# Patient Record
Sex: Male | Born: 1975 | Race: White | Hispanic: No | Marital: Married | Smoking: Current every day smoker
Health system: Southern US, Community
[De-identification: ages and names within clinical notes are randomized; demographics above are authoritative.]

---

## 2005-06-21 ENCOUNTER — Emergency Department (HOSPITAL_COMMUNITY): Admission: RE | Admit: 2005-06-21 | Discharge: 2005-06-22 | Payer: Self-pay | Admitting: Emergency Medicine

## 2005-06-23 ENCOUNTER — Ambulatory Visit: Admission: RE | Admit: 2005-06-23 | Discharge: 2005-06-23 | Payer: Self-pay | Admitting: Orthopaedic Surgery

## 2005-07-10 ENCOUNTER — Encounter: Admission: RE | Admit: 2005-07-10 | Discharge: 2005-07-10 | Payer: Self-pay | Admitting: Orthopaedic Surgery

## 2011-06-10 ENCOUNTER — Emergency Department (HOSPITAL_COMMUNITY)
Admission: EM | Admit: 2011-06-10 | Discharge: 2011-06-11 | Disposition: A | Discharge status: Home / Self Care | Payer: No Typology Code available for payment source | Attending: Emergency Medicine | Admitting: Emergency Medicine

## 2011-06-10 ENCOUNTER — Emergency Department (HOSPITAL_COMMUNITY): Payer: No Typology Code available for payment source

## 2011-06-10 DIAGNOSIS — S61209A Unspecified open wound of unspecified finger without damage to nail, initial encounter: Secondary | ICD-10-CM | POA: Insufficient documentation

## 2011-06-10 DIAGNOSIS — M542 Cervicalgia: Secondary | ICD-10-CM | POA: Insufficient documentation

## 2011-06-10 DIAGNOSIS — S129XXA Fracture of neck, unspecified, initial encounter: Secondary | ICD-10-CM | POA: Insufficient documentation

## 2011-06-10 DIAGNOSIS — S9030XA Contusion of unspecified foot, initial encounter: Secondary | ICD-10-CM | POA: Insufficient documentation

## 2016-12-22 DIAGNOSIS — M7541 Impingement syndrome of right shoulder: Secondary | ICD-10-CM | POA: Diagnosis not present

## 2016-12-22 DIAGNOSIS — M7542 Impingement syndrome of left shoulder: Secondary | ICD-10-CM | POA: Diagnosis not present

## 2017-04-05 ENCOUNTER — Emergency Department (HOSPITAL_COMMUNITY): Payer: 59

## 2017-04-05 ENCOUNTER — Encounter (HOSPITAL_COMMUNITY): Payer: Self-pay | Admitting: Emergency Medicine

## 2017-04-05 ENCOUNTER — Inpatient Hospital Stay (HOSPITAL_COMMUNITY)
Admission: EM | Admit: 2017-04-05 | Discharge: 2017-04-07 | DRG: 603 | Disposition: A | Discharge status: Home / Self Care | Payer: 59 | Attending: Internal Medicine | Admitting: Internal Medicine

## 2017-04-05 DIAGNOSIS — F419 Anxiety disorder, unspecified: Secondary | ICD-10-CM | POA: Diagnosis present

## 2017-04-05 DIAGNOSIS — W458XXA Other foreign body or object entering through skin, initial encounter: Secondary | ICD-10-CM | POA: Diagnosis present

## 2017-04-05 DIAGNOSIS — S81011A Laceration without foreign body, right knee, initial encounter: Secondary | ICD-10-CM | POA: Diagnosis not present

## 2017-04-05 DIAGNOSIS — L03115 Cellulitis of right lower limb: Secondary | ICD-10-CM | POA: Diagnosis not present

## 2017-04-05 DIAGNOSIS — L02415 Cutaneous abscess of right lower limb: Secondary | ICD-10-CM | POA: Diagnosis not present

## 2017-04-05 DIAGNOSIS — Y92832 Beach as the place of occurrence of the external cause: Secondary | ICD-10-CM

## 2017-04-05 DIAGNOSIS — R6510 Systemic inflammatory response syndrome (SIRS) of non-infectious origin without acute organ dysfunction: Secondary | ICD-10-CM | POA: Diagnosis not present

## 2017-04-05 DIAGNOSIS — M25561 Pain in right knee: Secondary | ICD-10-CM

## 2017-04-05 DIAGNOSIS — D72829 Elevated white blood cell count, unspecified: Secondary | ICD-10-CM

## 2017-04-05 DIAGNOSIS — L03116 Cellulitis of left lower limb: Secondary | ICD-10-CM | POA: Diagnosis not present

## 2017-04-05 DIAGNOSIS — F17290 Nicotine dependence, other tobacco product, uncomplicated: Secondary | ICD-10-CM | POA: Diagnosis present

## 2017-04-05 DIAGNOSIS — R112 Nausea with vomiting, unspecified: Secondary | ICD-10-CM | POA: Diagnosis present

## 2017-04-05 DIAGNOSIS — L02416 Cutaneous abscess of left lower limb: Secondary | ICD-10-CM | POA: Diagnosis not present

## 2017-04-05 DIAGNOSIS — B9561 Methicillin susceptible Staphylococcus aureus infection as the cause of diseases classified elsewhere: Secondary | ICD-10-CM | POA: Diagnosis present

## 2017-04-05 DIAGNOSIS — M7040 Prepatellar bursitis, unspecified knee: Secondary | ICD-10-CM | POA: Diagnosis present

## 2017-04-05 LAB — CBC WITH DIFFERENTIAL/PLATELET
Basophils Absolute: 0 10*3/uL (ref 0.0–0.1)
Basophils Relative: 0 %
EOS PCT: 0 %
Eosinophils Absolute: 0.1 10*3/uL (ref 0.0–0.7)
HCT: 44.8 % (ref 39.0–52.0)
HEMOGLOBIN: 15.1 g/dL (ref 13.0–17.0)
LYMPHS PCT: 7 %
Lymphs Abs: 1.5 10*3/uL (ref 0.7–4.0)
MCH: 31 pg (ref 26.0–34.0)
MCHC: 33.7 g/dL (ref 30.0–36.0)
MCV: 92 fL (ref 78.0–100.0)
MONOS PCT: 9 %
Monocytes Absolute: 1.7 10*3/uL — ABNORMAL HIGH (ref 0.1–1.0)
Neutro Abs: 16.5 10*3/uL — ABNORMAL HIGH (ref 1.7–7.7)
Neutrophils Relative %: 84 %
Platelets: 320 10*3/uL (ref 150–400)
RBC: 4.87 MIL/uL (ref 4.22–5.81)
RDW: 13.2 % (ref 11.5–15.5)
WBC: 19.7 10*3/uL — ABNORMAL HIGH (ref 4.0–10.5)

## 2017-04-05 LAB — BASIC METABOLIC PANEL
Anion gap: 9 (ref 5–15)
BUN: 5 mg/dL — ABNORMAL LOW (ref 6–20)
CHLORIDE: 103 mmol/L (ref 101–111)
CO2: 25 mmol/L (ref 22–32)
Calcium: 8.7 mg/dL — ABNORMAL LOW (ref 8.9–10.3)
Creatinine, Ser: 0.84 mg/dL (ref 0.61–1.24)
GFR calc Af Amer: >60 mL/min (ref >60)
GFR calc non Af Amer: >60 mL/min (ref >60)
Glucose, Bld: 107 mg/dL — ABNORMAL HIGH (ref 65–99)
Potassium: 3.9 mmol/L (ref 3.5–5.1)
Sodium: 137 mmol/L (ref 135–145)

## 2017-04-05 LAB — C-REACTIVE PROTEIN: CRP: 19.8 mg/dL — ABNORMAL HIGH (ref <1.0)

## 2017-04-05 LAB — SEDIMENTATION RATE: Sed Rate: 28 mm/hr — ABNORMAL HIGH (ref 0–16)

## 2017-04-05 MED ORDER — ACETAMINOPHEN 500 MG PO TABS
1000.0000 mg | ORAL_TABLET | Freq: Once | ORAL | Status: AC
  Administered 2017-04-05: 1000 mg via ORAL
  Filled 2017-04-05: qty 2

## 2017-04-05 MED ORDER — PIPERACILLIN-TAZOBACTAM 3.375 G IVPB
3.3750 g | Freq: Three times a day (TID) | INTRAVENOUS | Status: DC
  Administered 2017-04-05 – 2017-04-07 (×5): 3.375 g via INTRAVENOUS
  Filled 2017-04-05 (×6): qty 50

## 2017-04-05 MED ORDER — ACETAMINOPHEN 325 MG PO TABS
650.0000 mg | ORAL_TABLET | Freq: Four times a day (QID) | ORAL | Status: DC | PRN
  Administered 2017-04-05: 650 mg via ORAL
  Filled 2017-04-05: qty 2

## 2017-04-05 MED ORDER — ACETAMINOPHEN 650 MG RE SUPP: 650.0000 mg | Freq: Four times a day (QID) | RECTAL | Status: DC | PRN

## 2017-04-05 MED ORDER — HYDROCODONE-ACETAMINOPHEN 5-325 MG PO TABS
1.0000 | ORAL_TABLET | ORAL | Status: DC | PRN
  Administered 2017-04-05 (×2): 2 via ORAL
  Administered 2017-04-06: 1 via ORAL
  Administered 2017-04-06: 2 via ORAL
  Filled 2017-04-05 (×4): qty 2

## 2017-04-05 MED ORDER — GADOBENATE DIMEGLUMINE 529 MG/ML IV SOLN
20.0000 mL | Freq: Once | INTRAVENOUS | Status: AC
  Administered 2017-04-05: 17 mL via INTRAVENOUS

## 2017-04-05 MED ORDER — IBUPROFEN 200 MG PO TABS
200.0000 mg | ORAL_TABLET | Freq: Four times a day (QID) | ORAL | Status: DC | PRN
  Administered 2017-04-05 – 2017-04-06 (×2): 200 mg via ORAL
  Filled 2017-04-05 (×2): qty 1

## 2017-04-05 MED ORDER — VANCOMYCIN HCL 10 G IV SOLR
1500.0000 mg | Freq: Two times a day (BID) | INTRAVENOUS | Status: DC
  Administered 2017-04-06 – 2017-04-07 (×3): 1500 mg via INTRAVENOUS
  Filled 2017-04-05 (×4): qty 1500

## 2017-04-05 MED ORDER — NICOTINE 14 MG/24HR TD PT24
14.0000 mg | MEDICATED_PATCH | TRANSDERMAL | Status: DC
  Administered 2017-04-05 – 2017-04-06 (×2): 14 mg via TRANSDERMAL
  Filled 2017-04-05 (×2): qty 1

## 2017-04-05 MED ORDER — ONDANSETRON HCL 4 MG/2ML IJ SOLN: 4.0000 mg | Freq: Four times a day (QID) | INTRAMUSCULAR | Status: DC | PRN

## 2017-04-05 MED ORDER — VANCOMYCIN HCL 500 MG IV SOLR
500.0000 mg | Freq: Once | INTRAVENOUS | Status: AC
  Administered 2017-04-05: 500 mg via INTRAVENOUS
  Filled 2017-04-05: qty 500

## 2017-04-05 MED ORDER — ONDANSETRON HCL 4 MG/2ML IJ SOLN
4.0000 mg | Freq: Once | INTRAMUSCULAR | Status: AC
  Administered 2017-04-05: 4 mg via INTRAVENOUS
  Filled 2017-04-05: qty 2

## 2017-04-05 MED ORDER — ONDANSETRON HCL 4 MG PO TABS: 4.0000 mg | ORAL_TABLET | Freq: Four times a day (QID) | ORAL | Status: DC | PRN

## 2017-04-05 MED ORDER — MORPHINE SULFATE (PF) 4 MG/ML IV SOLN
4.0000 mg | INTRAVENOUS | Status: DC | PRN
  Administered 2017-04-05 – 2017-04-06 (×4): 4 mg via INTRAVENOUS
  Filled 2017-04-05 (×4): qty 1

## 2017-04-05 MED ORDER — VANCOMYCIN HCL IN DEXTROSE 1-5 GM/200ML-% IV SOLN
1000.0000 mg | Freq: Once | INTRAVENOUS | Status: AC
  Administered 2017-04-05: 1000 mg via INTRAVENOUS
  Filled 2017-04-05: qty 200

## 2017-04-05 MED ORDER — PIPERACILLIN-TAZOBACTAM 3.375 G IVPB 30 MIN
3.3750 g | Freq: Once | INTRAVENOUS | Status: AC
  Administered 2017-04-05: 3.375 g via INTRAVENOUS
  Filled 2017-04-05: qty 50

## 2017-04-05 MED ORDER — MORPHINE SULFATE (PF) 4 MG/ML IV SOLN
4.0000 mg | Freq: Once | INTRAVENOUS | Status: AC
  Administered 2017-04-05: 4 mg via INTRAVENOUS
  Filled 2017-04-05: qty 1

## 2017-04-05 MED ORDER — LIDOCAINE-EPINEPHRINE 1 %-1:100000 IJ SOLN: 10.0000 mL | Freq: Once | INTRAMUSCULAR | Status: DC

## 2017-04-05 MED ORDER — LIDOCAINE-EPINEPHRINE (PF) 2 %-1:200000 IJ SOLN
10.0000 mL | Freq: Once | INTRAMUSCULAR | Status: AC
  Administered 2017-04-05: 10 mL via INTRADERMAL
  Filled 2017-04-05: qty 20

## 2017-04-05 NOTE — ED Notes (Signed)
Ortho PA at bedside.  

## 2017-04-05 NOTE — ED Provider Notes (Signed)
[facility] Provider Note   CSN: [id number] Arrival date & time: 04/05/17  0848     History   Chief Complaint Chief Complaint  Patient presents with  . Knee Pain    HPI Alex Hurst is a 41 y.o. male.  HPI  Pt presenting with c/o right knee and leg swelling and pain.  Pt states he had a small cut over his knee 2 weeks ago while he was at the beach.  The cut appeared to be healing normally and had a scab overlying, 2 days ago he got down onto his knees playing with children and felt a lot of pain in his knee, the scab came off, there was no drainage.  2 mornings ago he could not bear weight on the right knee and the knee and leg were red and swollen.  Symptoms have gotten worse over the 2 days.  He is not able to bend his knee without significant pain.  He has had subjective fever at home with chills.  No treatment prior to arrival.  Redness has increased down to ankle and going up thigh as well.  There are no other associated systemic symptoms, there are no other alleviating or modifying factors.   History reviewed. No pertinent past medical history.  Patient Active Problem List   Diagnosis Date Noted  . Abscess of knee, right 04/05/2017  . Leukocytosis 04/05/2017    History reviewed. No pertinent surgical history.     Home Medications    Prior to Admission medications   Medication Sig Start Date End Date Taking? Authorizing Provider  ibuprofen (ADVIL,MOTRIN) 200 MG tablet Take 200 mg by mouth every 6 (six) hours as needed for moderate pain.   Yes [provider]  pseudoephedrine (SUDAFED) 30 MG tablet Take 30 mg by mouth every 4 (four) hours as needed for congestion.   Yes [provider]  oxyCODONE-acetaminophen (PERCOCET/ROXICET) 5-325 MG tablet Take 1-2 tablets by mouth every 3 (three) hours as needed for moderate pain. 04/07/17   Dorothea Ogle, MD  sulfamethoxazole-trimethoprim (BACTRIM DS,SEPTRA DS) 800-160 MG tablet Take 1 tablet by  mouth 2 (two) times daily. 04/07/17   Dorothea Ogle, MD    Family History Family History  Problem Relation Age of Onset  . Lung cancer Maternal Grandmother   . Lung cancer Paternal Grandmother     Social History Social History  Substance Use Topics  . Smoking status: Current Every Day Smoker  . Smokeless tobacco: Current User  . Alcohol use Yes     Allergies   Patient has no known allergies.   Review of Systems Review of Systems  ROS reviewed and all otherwise negative except for mentioned in HPI   Physical Exam Updated Vital Signs BP 115/79 (BP Location: Left Arm)   Pulse 81   Temp 97.8 F (36.6 C) (Oral)   Resp 18   Ht 5\' 6"  (1.676 m)   Wt 82.6 kg (182 lb)   SpO2 97%   BMI 29.38 kg/m  Vitals reviewed Physical Exam Physical Examination: General appearance - alert, well appearing, and in no distress Mental status - alert, oriented to person, place, and time Eyes - no conjunctival injection, no scleral icterus Chest - clear to auscultation, no wheezes, rales or rhonchi, symmetric air entry Heart - normal rate, regular rhythm, normal S1, S2, no murmurs, rubs, clicks or gallops Neurological - alert, oriented, normal speech, no focal findings or movement disorder noted Musculoskeletal -right knee with significant swelling  and pain with minimal attempts at ROM,  No ballotable effusion, no bondy tenderness, deformity Extremities - peripheral pulses normal,no clubbing or cyanosis Skin - significant erythema overlying knee joint- streaking up thing and over most of lower extremity, blanching  ED Treatments / Results  Labs (all labs ordered are listed, but only abnormal results are displayed) Labs Reviewed  CBC WITH DIFFERENTIAL/PLATELET - Abnormal; Notable for the following:       Result Value   WBC 19.7 (*)    Neutro Abs 16.5 (*)    Monocytes Absolute 1.7 (*)    All other components within normal limits  BASIC METABOLIC PANEL - Abnormal; Notable for the  following:    Glucose, Bld 107 (*)    BUN 5 (*)    Calcium 8.7 (*)    All other components within normal limits  SEDIMENTATION RATE - Abnormal; Notable for the following:    Sed Rate 28 (*)    All other components within normal limits  C-REACTIVE PROTEIN - Abnormal; Notable for the following:    CRP 19.8 (*)    All other components within normal limits  CBC - Abnormal; Notable for the following:    WBC 16.4 (*)    All other components within normal limits  BASIC METABOLIC PANEL - Abnormal; Notable for the following:    Calcium 8.5 (*)    All other components within normal limits  CBC - Abnormal; Notable for the following:    WBC 15.9 (*)    All other components within normal limits  BASIC METABOLIC PANEL - Abnormal; Notable for the following:    BUN 5 (*)    Calcium 8.8 (*)    All other components within normal limits  CULTURE, BLOOD (ROUTINE X 2)  CULTURE, BLOOD (ROUTINE X 2)  AEROBIC CULTURE (SUPERFICIAL SPECIMEN)    EKG  EKG Interpretation None       Radiology No results found.  Procedures Procedures (including critical care time)  Medications Ordered in ED Medications  morphine 4 MG/ML injection 4 mg (4 mg Intravenous Given 04/05/17 1050)  ondansetron (ZOFRAN) injection 4 mg (4 mg Intravenous Given 04/05/17 1045)  acetaminophen (TYLENOL) tablet 1,000 mg (1,000 mg Oral Given 04/05/17 1052)  gadobenate dimeglumine (MULTIHANCE) injection 20 mL (17 mLs Intravenous Contrast Given 04/05/17 1155)  vancomycin (VANCOCIN) IVPB 1000 mg/200 mL premix (0 mg Intravenous Stopped 04/05/17 1532)  piperacillin-tazobactam (ZOSYN) IVPB 3.375 g (0 g Intravenous Stopped 04/05/17 1401)  lidocaine-EPINEPHrine (XYLOCAINE W/EPI) 2 %-1:200000 (PF) injection 10 mL (10 mLs Intradermal Given by Other 04/05/17 1315)  vancomycin (VANCOCIN) 500 mg in sodium chloride 0.9 % 100 mL IVPB (0 mg Intravenous Stopped 04/05/17 1952)     Initial Impression / Assessment and Plan / ED Course  I have reviewed the  triage vital signs and the nursing notes.  Pertinent labs & imaging results that were available during my care of the patient were reviewed by me and considered in my medical decision making (see chart for details).    10:18 AM xray and WBC obtained- WBC elevated-  D/w ortho who will see patient in the ED in consultation- concern for septic joint- will await their recommendations before starting abx.  Pt remains stable.  .   10:45 AM ortho has seen patient and is recommending MR knee- this was ordered and they advise no abx in case they need to do arthocentesis.   2:18 PM  D/w Dr. Montez Moritaarter, [facility] for admission,  She will see patient in the  ED.       Final Clinical Impressions(s) / ED Diagnoses   Final diagnoses:  Cellulitis and abscess of right lower extremity  Leukocytosis, unspecified type    New Prescriptions Discharge Medication List as of 04/07/2017  9:39 AM    START taking these medications   Details  oxyCODONE-acetaminophen (PERCOCET/ROXICET) 5-325 MG tablet Take 1-2 tablets by mouth every 3 (three) hours as needed for moderate pain., Starting Sat 04/07/2017, Print    sulfamethoxazole-trimethoprim (BACTRIM DS,SEPTRA DS) 800-160 MG tablet Take 1 tablet by mouth 2 (two) times daily., Starting Sat 04/07/2017, Print         Jerelyn Scott, MD 04/12/17 [phone]

## 2017-04-05 NOTE — H&P (Signed)
History and Physical    Alex Hurst VHQ:[id number] DOB: Oct 04, 1975 DOA: 04/05/2017  PCP: [name], [name], MD   Patient coming from: Home  Chief Complaint: Right knee pain and swelling  HPI: Alex Hurst is a 41 y.o. gentleman with no significant past medical history who was in his baseline state of health until about 1.5 weeks ago.  While at the beach, he cut his right knee on a seashell.  There was no significant bleeding or drainage at that time, and the initial injury had grown as scab.  This week, he was down on his knees in the garage working (without any type of dressing to his knee) and since then he has developed progressive pain and swelling.  He has had subjective fevers and last night he had chills and sweats.  Pain was 12 out of 10 in intensity upon arrival to the ED today.  No rashes.  No bites.  He has has some nausea but no vomiting.  ED Course: Temp 99.9.  WBC count 19.7.  MRI of the right knee showed cellulitis and multiple subcutaneous abscess.  No joint involvement.  Orthopedic surgery was consulted in the ED, and I and D was performed at the bedside.  He has received vanc and zosyn.   Hospitalist asked to admit for IV antibiotics.  Patient reluctant to stay.  Review of Systems: As per HPI otherwise 10 systems reviewed and negative.   History reviewed. No pertinent past medical history.  History reviewed. No pertinent surgical history.   reports that he has been smoking.  He uses smokeless tobacco. He reports that he drinks alcohol. He reports that he does not use drugs.  Smokes 1ppd.  Occasional EtOH but no history of EtOH withdrawal.  No illicit drugs.  He is married.  He has two children.  [place] [employer] [place]  No Known Allergies  Family History  Problem Relation Age of Onset  . Lung cancer Maternal Grandmother   . Lung cancer Paternal Grandmother      Prior to Admission medications   Medication Sig Start Date End Date Taking? Authorizing  Provider  ibuprofen (ADVIL,MOTRIN) 200 MG tablet Take 200 mg by mouth every 6 (six) hours as needed for moderate pain.   Yes [provider]  pseudoephedrine (SUDAFED) 30 MG tablet Take 30 mg by mouth every 4 (four) hours as needed for congestion.   Yes [provider]    Physical Exam: Vitals:   04/05/17 1045 04/05/17 1300 04/05/17 1330 04/05/17 1400  BP: (!) 125/92 123/82 116/75 116/75  Pulse: 90 88 85 85  Resp: 16 16 16 16   Temp:      TempSrc:      SpO2: 96% 97% 95% 95%  Weight:      Height:          Constitutional: NAD, complaining of right knee pain, he is anxious Vitals:   04/05/17 1045 04/05/17 1300 04/05/17 1330 04/05/17 1400  BP: (!) 125/92 123/82 116/75 116/75  Pulse: 90 88 85 85  Resp: 16 16 16 16   Temp:      TempSrc:      SpO2: 96% 97% 95% 95%  Weight:      Height:       Eyes: PERRL, lids and conjunctivae normal ENMT: Mucous membranes are moist. Posterior pharynx clear of any exudate or lesions. Normal dentition.  Neck: normal appearance, supple, no masses Respiratory: clear to auscultation bilaterally, no wheezing, no crackles. Normal respiratory effort.  No accessory muscle use.  Cardiovascular: Normal rate, regular rhythm, no murmurs / rubs / gallops. No extremity edema. 2+ pedal pulses.  GI: abdomen is soft and compressible.  No distention.  No tenderness.  Bowel sounds are present. Musculoskeletal:  Right knee is markedly swollen compared to the left.  No joint deformity in upper extremities. Limited ROM at right knee due to pain and swelling.  No contractures. Normal muscle tone.  Skin: no rashes, dry, skin at right knee and right leg warm to touch.  Dressing to right knee at site of I and D.  Erythema spans from just above the right knee to ankle.  I did not find any significant areas of induration. Neurologic: No apparent focal deficits. Psychiatric: Normal judgment and insight. Alert and oriented x 3. Normal mood.     Labs on  Admission: I have personally reviewed following labs and imaging studies  CBC:  Recent Labs Lab 04/05/17 0932  WBC 19.7*  NEUTROABS 16.5*  HGB 15.1  HCT 44.8  MCV 92.0  PLT 320   Basic Metabolic Panel:  Recent Labs Lab 04/05/17 0932  NA 137  K 3.9  CL 103  CO2 25  GLUCOSE 107*  BUN 5*  CREATININE 0.84  CALCIUM 8.7*   GFR: Estimated Creatinine Clearance: 117.9 mL/min (by C-G formula based on SCr of 0.84 mg/dL).  Radiological Exams on Admission: Mr Knee Right W Wo Contrast  Result Date: 04/05/2017 CLINICAL DATA:  History of laceration on the right knee by a sea shell mid June,2018. The patient developed redness, swelling, fever and chills over the past week. EXAM: MRI OF THE RIGHT KNEE WITHOUT AND WITH CONTRAST TECHNIQUE: Multiplanar, multisequence MR imaging of the knee was performed before and after the administration of intravenous contrast. CONTRAST:  17 ml MULTIHANCE GADOBENATE DIMEGLUMINE 529 MG/ML IV SOLN COMPARISON:  Plain films right knee this same day. FINDINGS: MENISCI Medial meniscus:  Intact. Lateral meniscus:  Intact. LIGAMENTS Cruciates:  Intact. Collaterals:  Intact. CARTILAGE Patellofemoral:  Normal. Medial:  Normal. Lateral:  Normal. Joint:  Trace amount of joint fluid. Popliteal Fossa:  No Baker's cyst. Extensor Mechanism:  Intact. Bones:  Normal marrow signal throughout. Other: Intense subcutaneous edema and enhancement are present about the knee diffusely. A subcutaneous abscess measuring 0.9 cm AP x 2.8 cm transverse x 1.8 cm craniocaudal is seen in the midline just below the inferior pole of the patella. A second somewhat focal area of fluid worrisome for abscess in the subcutaneous tissues is seen along the medial aspect of the proximal tibia and measures 1.9 cm AP by 0.4 cm transverse by 4.3 cm craniocaudal. More ill-defined areas of fluid in subcutaneous tissues about the knee consistent with phlegmon. IMPRESSION: Intense cellulitis about the knee with a  subcutaneous abscess just off the inferior pole of the patella and along the medial tibia as described above. There may be a second smaller abscess in the subcutaneous tissues along the medial aspect of the proximal tibia. Multiple additional smaller areas of ill defined fluid are identified compatible with phlegmon. Negative for osteomyelitis or septic joint. Negative for meniscal or ligament tear. Electronically Signed   By: Drusilla Kanner M.D.   On: 04/05/2017 12:49   Dg Knee Complete 4 Views Right  Result Date: 04/05/2017 CLINICAL DATA:  Recent soft tissue laceration with pain and swelling EXAM: RIGHT KNEE - COMPLETE 4+ VIEW COMPARISON:  None. FINDINGS: No acute fracture or dislocation is noted. No joint effusion is seen. Considerable soft tissue edema  is noted area anterior to the patella consistent with the recent injury. No radiopaque foreign body is noted. IMPRESSION: Soft tissue changes without acute bony abnormality. Electronically Signed   By: [name] M.D.   On: 04/05/2017 09:58    Assessment/Plan Principal Problem:   Abscess of knee, right Active Problems:   Leukocytosis      Right knee pre-patellar abscess (likely has more than one per MRI report) S/P trauma.  Patient is not frankly septic. --Ortho consult appreciated. --Continue IV vanc and zosyn for now --Blood and wound cultures pending. --Analgesics as needed  Leukocytosis secondary to acute infection --Repeat CBC in AM to look for downward trend  Active tobacco use --Patient has declined nicotine patch at this time.  Anxiety --patient has declined low dose anxiolytic at this time.   DVT prophylaxis: Early ambulation Code Status: FULL Family Communication: Wife present in the ED at time of admission. Disposition Plan: Expect he will go home at discharge. Consults called: Orthopedic surgery Admission status: Inpatient, med surg.  Based on the degree of cellulitis and proximity of abscesses on MRI imaging to  major joint (right knee), I would expect this patient would benefit from inpatient services (IV antibiotics) for at least two midnights.   TIME SPENT: 60 minutes   Jerene Bears MD [facility] Pager [phone]  If 7PM-7AM, please contact night-coverage [email or web address] Password [email or web address]  04/05/2017, 3:00 PM

## 2017-04-05 NOTE — Procedures (Signed)
Procedure: I&D right knee  Indication: Right knee abscess  Surgeon: Charma IgoMichael Kemper Hochman, PA-C  Assist: None  Anesthesia: 5ml 1% lidocaine w/epi  EBL: None  Complications: None  Findings: Risks/benefits explained and pt wished to proceed. Consent obtained. Anesthesia infiltrated inferior to patella and 2cm incision was made. Watery fluid encountered and culture obtained. Cavity was explored with hemostat and swabs without further collections found. Packed with 1/4" iodoform gauze. Pt tolerated procedure well.    Alex CaldronMichael J. Kai Railsback, PA-C Orthopedic Surgery [phone]

## 2017-04-05 NOTE — Consult Note (Addendum)
Reason for Consult:Knee pain Referring Physician: Ledell Noss  Alex Hurst is an 41 y.o. male.  HPI: Alex Hurst came to the ED for evaluation of right knee pain, swelling, and redness. He was at the beach a couple of weeks ago and cut it on a shell. It seemed to be healing well but began to get painful day before yesterday and then dramatically more so yesterday. He denies any drainage or fevers.  History reviewed. No pertinent past medical history.  History reviewed. No pertinent surgical history.  No family history on file.  Social History:  reports that he has been smoking.  He uses smokeless tobacco. He reports that he drinks alcohol. He reports that he does not use drugs.  Allergies: Not on File  Medications: I have reviewed the patient's current medications.  Results for orders placed or performed during the hospital encounter of 04/05/17 (from the past 48 hour(s))  CBC with Differential/Platelet     Status: Abnormal   Collection Time: 04/05/17  9:32 AM  Result Value Ref Range   WBC 19.7 (H) 4.0 - 10.5 K/uL   RBC 4.87 4.22 - 5.81 MIL/uL   Hemoglobin 15.1 13.0 - 17.0 g/dL   HCT 44.8 39.0 - 52.0 %   MCV 92.0 78.0 - 100.0 fL   MCH 31.0 26.0 - 34.0 pg   MCHC 33.7 30.0 - 36.0 g/dL   RDW 13.2 11.5 - 15.5 %   Platelets 320 150 - 400 K/uL   Neutrophils Relative % 84 %   Neutro Abs 16.5 (H) 1.7 - 7.7 K/uL   Lymphocytes Relative 7 %   Lymphs Abs 1.5 0.7 - 4.0 K/uL   Monocytes Relative 9 %   Monocytes Absolute 1.7 (H) 0.1 - 1.0 K/uL   Eosinophils Relative 0 %   Eosinophils Absolute 0.1 0.0 - 0.7 K/uL   Basophils Relative 0 %   Basophils Absolute 0.0 0.0 - 0.1 K/uL  Basic metabolic panel     Status: Abnormal   Collection Time: 04/05/17  9:32 AM  Result Value Ref Range   Sodium 137 135 - 145 mmol/L   Potassium 3.9 3.5 - 5.1 mmol/L   Chloride 103 101 - 111 mmol/L   CO2 25 22 - 32 mmol/L   Glucose, Bld 107 (H) 65 - 99 mg/dL   BUN 5 (L) 6 - 20 mg/dL   Creatinine, Ser 0.84 0.61 -  1.24 mg/dL   Calcium 8.7 (L) 8.9 - 10.3 mg/dL   GFR calc non Af Amer >60 >60 mL/min   GFR calc Af Amer >60 >60 mL/min    Comment: (NOTE) The eGFR has been calculated using the CKD EPI equation. This calculation has not been validated in all clinical situations. eGFR's persistently <60 mL/min signify possible Chronic Kidney Disease.    Anion gap 9 5 - 15  Sedimentation rate     Status: Abnormal   Collection Time: 04/05/17  9:32 AM  Result Value Ref Range   Sed Rate 28 (H) 0 - 16 mm/hr  C-reactive protein     Status: Abnormal   Collection Time: 04/05/17 10:30 AM  Result Value Ref Range   CRP 19.8 (H) <1.0 mg/dL    Mr Knee Right W Wo Contrast  Result Date: 04/05/2017 CLINICAL DATA:  History of laceration on the right knee by a sea shell mid June,2018. The patient developed redness, swelling, fever and chills over the past week. EXAM: MRI OF THE RIGHT KNEE WITHOUT AND WITH CONTRAST TECHNIQUE: Multiplanar, multisequence  MR imaging of the knee was performed before and after the administration of intravenous contrast. CONTRAST:  17 ml MULTIHANCE GADOBENATE DIMEGLUMINE 529 MG/ML IV SOLN COMPARISON:  Plain films right knee this same day. FINDINGS: MENISCI Medial meniscus:  Intact. Lateral meniscus:  Intact. LIGAMENTS Cruciates:  Intact. Collaterals:  Intact. CARTILAGE Patellofemoral:  Normal. Medial:  Normal. Lateral:  Normal. Joint:  Trace amount of joint fluid. Popliteal Fossa:  No Baker's cyst. Extensor Mechanism:  Intact. Bones:  Normal marrow signal throughout. Other: Intense subcutaneous edema and enhancement are present about the knee diffusely. A subcutaneous abscess measuring 0.9 cm AP x 2.8 cm transverse x 1.8 cm craniocaudal is seen in the midline just below the inferior pole of the patella. A second somewhat focal area of fluid worrisome for abscess in the subcutaneous tissues is seen along the medial aspect of the proximal tibia and measures 1.9 cm AP by 0.4 cm transverse by 4.3 cm  craniocaudal. More ill-defined areas of fluid in subcutaneous tissues about the knee consistent with phlegmon. IMPRESSION: Intense cellulitis about the knee with a subcutaneous abscess just off the inferior pole of the patella and along the medial tibia as described above. There may be a second smaller abscess in the subcutaneous tissues along the medial aspect of the proximal tibia. Multiple additional smaller areas of ill defined fluid are identified compatible with phlegmon. Negative for osteomyelitis or septic joint. Negative for meniscal or ligament tear. Electronically Signed   By: Inge Rise M.D.   On: 04/05/2017 12:49   Dg Knee Complete 4 Views Right  Result Date: 04/05/2017 CLINICAL DATA:  Recent soft tissue laceration with pain and swelling EXAM: RIGHT KNEE - COMPLETE 4+ VIEW COMPARISON:  None. FINDINGS: No acute fracture or dislocation is noted. No joint effusion is seen. Considerable soft tissue edema is noted area anterior to the patella consistent with the recent injury. No radiopaque foreign body is noted. IMPRESSION: Soft tissue changes without acute bony abnormality. Electronically Signed   By: Inez Catalina M.D.   On: 04/05/2017 09:58    Review of Systems  Constitutional: Negative for weight loss.  HENT: Negative for ear discharge, ear pain, hearing loss and tinnitus.   Eyes: Negative for blurred vision, double vision, photophobia and pain.  Respiratory: Negative for cough, sputum production and shortness of breath.   Cardiovascular: Negative for chest pain.  Gastrointestinal: Negative for abdominal pain, nausea and vomiting.  Genitourinary: Negative for dysuria, flank pain, frequency and urgency.  Musculoskeletal: Positive for joint pain (Right knee). Negative for back pain, falls, myalgias and neck pain.  Neurological: Negative for dizziness, tingling, sensory change, focal weakness, loss of consciousness and headaches.  Endo/Heme/Allergies: Does not bruise/bleed easily.   Psychiatric/Behavioral: Negative for depression, memory loss and substance abuse. The patient is not nervous/anxious.    Blood pressure 123/82, pulse 88, temperature 99.9 F (37.7 C), temperature source Oral, resp. rate 16, height 5' 6"  (1.676 m), weight 82.6 kg (182 lb), SpO2 97 %. Physical Exam  Constitutional: He appears well-developed and well-nourished. No distress.  HENT:  Head: Normocephalic.  Eyes: Conjunctivae are normal. Right eye exhibits no discharge. Left eye exhibits no discharge. No scleral icterus.  Cardiovascular: Normal rate and regular rhythm.   Respiratory: Effort normal. No respiratory distress.  Musculoskeletal:  RLE No traumatic wounds or ecchymosis, erythema knee to mid shin, knee swollen  Moderate TTP  Knee ROM limited to 170-150 degrees  Sens DPN, SPN, TN intact  Motor EHL, ext, flex, evers 5/5  DP  2+, PT 2+, No significant edema     Neurological: He is alert.  Skin: Skin is warm and dry. He is not diaphoretic.  Psychiatric: He has a normal mood and affect. His behavior is normal.    Assessment/Plan: Right lower extremity cellulitis -- Medicine admit for cellulitis, agree with vanc/Zosyn initially. Culture sent from I&D. Ok to [employer] on knee. Packing should stay in place until tomorrow earliest.    Lisette Abu, PA-C Orthopedic Surgery [phone] 04/05/2017, 1:45 PM   Attestation:  I have seen and examined the patient and agree with the above findings by Hilbert Odor PA-C. On review of the MRI this is not a septic arthritis of the right knee rather a septic prepatellar bursitis. Likely this was seated when he had the superficial wound at the beach from a shell. Then while crawling around on his knees 2 days ago was likely enough to incite the septic response. He clearly also has a rather robust cellulitic right leg in association with the prepatellar bursitis. The I&D of that prepatellar bursa should be sufficient to decompress that and we'll  certainly probably need concomitant IV antibiotics for at least 24 hours before changing to by mouth. I've instructed the patient to begin pulling the iodoform gauze out 1/2 inch per day starting tomorrow, this should continue until all of the gauze was removed. In addition to that he should begin Dial soap soaks to the right knee which I have instructed him on how to accomplish, starting in 24 hours for 2 weeks or until the wound is closed.  Nicholes Stairs, MD Orthopedic surgeon

## 2017-04-05 NOTE — ED Notes (Signed)
Tobi BastosAnna, RN accepts report

## 2017-04-05 NOTE — ED Notes (Signed)
Pt remains in MRI at this time  

## 2017-04-05 NOTE — Progress Notes (Signed)
Pharmacy Antibiotic Note  Sherilyn DacostaJoshua L Bowden is a 41 y.o. male admitted on 04/05/2017 with cellulitis.  Pharmacy has been consulted for Zosyn and vancomycin dosing.  Given one time doses in the ED. SCr stable, CrCl > 17100ml/min. Afebrile, WBC wnl 19.7.  Plan: Continue Zosyn 3.375 gm IV q8h (4 hour infusion) Give extra 500mg  of vancomycin, then start vancomycin 1.5g IV Q12h Monitor clinical picture, renal function, VT prn F/U C&S, abx deescalation / LOT  Height: 5\' 6"  (167.6 cm) Weight: 182 lb (82.6 kg) IBW/kg (Calculated) : 63.8  Temp (24hrs), Avg:99.9 F (37.7 C), Min:99.9 F (37.7 C), Max:99.9 F (37.7 C)   Recent Labs Lab 04/05/17 0932  WBC 19.7*  CREATININE 0.84    Estimated Creatinine Clearance: 117.9 mL/min (by C-G formula based on SCr of 0.84 mg/dL).    No Known Allergies   Thank you for allowing pharmacy to be a part of this patient's care.  Armandina StammerBATCHELDER,Jeison Delpilar J 04/05/2017 3:02 PM

## 2017-04-05 NOTE — ED Triage Notes (Signed)
Pt. Stated, [place] and it was still sore to touch, but the last 2 days its been unbearable. Its hot , tender and I've had chills.  The cut was the middle of June.

## 2017-04-05 NOTE — ED Notes (Signed)
Hospitalist at bedside 

## 2017-04-05 NOTE — ED Notes (Signed)
PT remains in MRI.

## 2017-04-05 NOTE — ED Notes (Signed)
Doctor at bedside.

## 2017-04-06 DIAGNOSIS — L02415 Cutaneous abscess of right lower limb: Principal | ICD-10-CM

## 2017-04-06 LAB — CBC
HCT: 42.2 % (ref 39.0–52.0)
HEMOGLOBIN: 13.5 g/dL (ref 13.0–17.0)
MCH: 30.1 pg (ref 26.0–34.0)
MCHC: 32 g/dL (ref 30.0–36.0)
MCV: 94.2 fL (ref 78.0–100.0)
PLATELETS: 324 10*3/uL (ref 150–400)
RBC: 4.48 MIL/uL (ref 4.22–5.81)
RDW: 13.5 % (ref 11.5–15.5)
WBC: 16.4 10*3/uL — AB (ref 4.0–10.5)

## 2017-04-06 LAB — BASIC METABOLIC PANEL
ANION GAP: 5 (ref 5–15)
BUN: 6 mg/dL (ref 6–20)
CALCIUM: 8.5 mg/dL — AB (ref 8.9–10.3)
CO2: 29 mmol/L (ref 22–32)
CREATININE: 0.9 mg/dL (ref 0.61–1.24)
Chloride: 101 mmol/L (ref 101–111)
GFR calc non Af Amer: >60 mL/min (ref >60)
Glucose, Bld: 99 mg/dL (ref 65–99)
POTASSIUM: 3.5 mmol/L (ref 3.5–5.1)
SODIUM: 135 mmol/L (ref 135–145)

## 2017-04-06 MED ORDER — IBUPROFEN 200 MG PO TABS: 200.0000 mg | ORAL_TABLET | Freq: Four times a day (QID) | ORAL | Status: DC | PRN

## 2017-04-06 MED ORDER — HYDROXYZINE HCL 25 MG PO TABS: 25.0000 mg | ORAL_TABLET | Freq: Three times a day (TID) | ORAL | Status: DC | PRN

## 2017-04-06 MED ORDER — OXYCODONE-ACETAMINOPHEN 5-325 MG PO TABS
1.0000 | ORAL_TABLET | ORAL | Status: DC | PRN
  Administered 2017-04-06 – 2017-04-07 (×6): 2 via ORAL
  Filled 2017-04-06 (×6): qty 2

## 2017-04-06 MED ORDER — HYDROMORPHONE HCL 1 MG/ML IJ SOLN
1.0000 mg | INTRAMUSCULAR | Status: DC | PRN
  Administered 2017-04-06 – 2017-04-07 (×6): 1 mg via INTRAVENOUS
  Filled 2017-04-06 (×6): qty 1

## 2017-04-06 NOTE — Progress Notes (Signed)
Patient ID: Alex DacostaJoshua L Milligan, male   DOB: 11/11/1975, 41 y.o.   MRN: [id number]    PROGRESS NOTE  Alex DacostaJoshua L Granade  [id number] DOB: 11/11/1975 DOA: 04/05/2017  PCP: [name], [name], MD   Brief Narrative:  41 yo male presented with several days duration of progressively worsening RLE pain and swelling associated with erythema and TTP.   Assessment & Plan:   SIRS secondary to Right knee pre-patellar abscess, RLE cellulitis  - s/p right knee I&D of the right knee prepatellar bursa abscess - still with significant swelling and erythema in RLE - will continue current ABX Vanc and Zosyn day #2 - continue to provide analgesia as needed - appreciate ortho input  - WBC is trending down, will repeat CBC in AM  Active tobacco use - counseled on cessation   DVT prophylaxis: SCD's Code Status: Full  Family Communication: Patient at bedside  Disposition Plan: home in 2-3 days  Consultants:   Ortho   Procedures:   Right knee abscess I&D 7/5  Antimicrobials:   Vancomycin 7/5 -->  Zosyn 7/5 -->  Subjective: Pt reports persistent soreness and pain in RLE.  Objective: Vitals:   04/05/17 1400 04/05/17 2054 04/06/17 0124 04/06/17 0613  BP: 116/75 129/72 114/80 113/71  Pulse: 85 92 75 85  Resp: 16 18 18 18   Temp:  99.1 F (37.3 C) 98.6 F (37 C) 98.3 F (36.8 C)  TempSrc:  Oral Oral Oral  SpO2: 95% 97% 98% 96%  Weight:      Height:        Intake/Output Summary (Last 24 hours) at 04/06/17 1555 Last data filed at 04/06/17 1300  Gross per 24 hour  Intake             1420 ml  Output                0 ml  Net             1420 ml   Filed Weights   04/05/17 0900  Weight: 82.6 kg (182 lb)    Examination:  General exam: Appears calm and comfortable  Respiratory system: Clear to auscultation. Respiratory effort normal. Cardiovascular system: S1 & S2 heard, RRR. No JVD, murmurs, rubs, gallops or clicks. No pedal edema. Gastrointestinal system: Abdomen is  nondistended, soft and nontender. No organomegaly or masses felt. Normal bowel sounds heard. Central nervous system: Alert and oriented. No focal neurological deficits. Extremities: Symmetric 5 x 5 power. RLE erythema and edema still present with TTP, warm to touch, anterior lower shin area to mid thigh area  Skin: No rashes, lesions or ulcers Psychiatry: Judgement and insight appear normal. Mood & affect appropriate.    Data Reviewed: I have personally reviewed following labs and imaging studies  CBC:  Recent Labs Lab 04/05/17 0932 04/06/17 0405  WBC 19.7* 16.4*  NEUTROABS 16.5*  --   HGB 15.1 13.5  HCT 44.8 42.2  MCV 92.0 94.2  PLT 320 324   Basic Metabolic Panel:  Recent Labs Lab 04/05/17 0932 04/06/17 0405  NA 137 135  K 3.9 3.5  CL 103 101  CO2 25 29  GLUCOSE 107* 99  BUN 5* 6  CREATININE 0.84 0.90  CALCIUM 8.7* 8.5*   Recent Results (from the past 240 hour(s))  Blood culture (routine x 2)     Status: None (Preliminary result)   Collection Time: 04/05/17  9:38 AM  Result Value Ref Range Status   Specimen Description BLOOD LEFT ANTECUBITAL  Final   Special Requests   Final    BOTTLES DRAWN AEROBIC AND ANAEROBIC Blood Culture adequate volume   Culture NO GROWTH 1 DAY  Final   Report Status PENDING  Incomplete  Blood culture (routine x 2)     Status: None (Preliminary result)   Collection Time: 04/05/17 10:30 AM  Result Value Ref Range Status   Specimen Description BLOOD RIGHT ANTECUBITAL  Final   Special Requests   Final    BOTTLES DRAWN AEROBIC AND ANAEROBIC Blood Culture adequate volume   Culture NO GROWTH 1 DAY  Final   Report Status PENDING  Incomplete  Wound or Superficial Culture     Status: None (Preliminary result)   Collection Time: 04/05/17  2:00 PM  Result Value Ref Range Status   Specimen Description WOUND KNEE  Final   Special Requests SOFT TISSUE ABSCESS  Final   Gram Stain   Final    RARE WBC PRESENT, PREDOMINANTLY PMN RARE GRAM POSITIVE  COCCI IN CLUSTERS RARE GRAM NEGATIVE RODS    Culture RARE STAPHYLOCOCCUS AUREUS  Final   Report Status PENDING  Incomplete    Radiology Studies: Mr Knee Right W Wo Contrast  Result Date: 04/05/2017 CLINICAL DATA:  History of laceration on the right knee by a sea shell mid June,2018. The patient developed redness, swelling, fever and chills over the past week. EXAM: MRI OF THE RIGHT KNEE WITHOUT AND WITH CONTRAST TECHNIQUE: Multiplanar, multisequence MR imaging of the knee was performed before and after the administration of intravenous contrast. CONTRAST:  17 ml MULTIHANCE GADOBENATE DIMEGLUMINE 529 MG/ML IV SOLN COMPARISON:  Plain films right knee this same day. FINDINGS: MENISCI Medial meniscus:  Intact. Lateral meniscus:  Intact. LIGAMENTS Cruciates:  Intact. Collaterals:  Intact. CARTILAGE Patellofemoral:  Normal. Medial:  Normal. Lateral:  Normal. Joint:  Trace amount of joint fluid. Popliteal Fossa:  No Baker's cyst. Extensor Mechanism:  Intact. Bones:  Normal marrow signal throughout. Other: Intense subcutaneous edema and enhancement are present about the knee diffusely. A subcutaneous abscess measuring 0.9 cm AP x 2.8 cm transverse x 1.8 cm craniocaudal is seen in the midline just below the inferior pole of the patella. A second somewhat focal area of fluid worrisome for abscess in the subcutaneous tissues is seen along the medial aspect of the proximal tibia and measures 1.9 cm AP by 0.4 cm transverse by 4.3 cm craniocaudal. More ill-defined areas of fluid in subcutaneous tissues about the knee consistent with phlegmon. IMPRESSION: Intense cellulitis about the knee with a subcutaneous abscess just off the inferior pole of the patella and along the medial tibia as described above. There may be a second smaller abscess in the subcutaneous tissues along the medial aspect of the proximal tibia. Multiple additional smaller areas of ill defined fluid are identified compatible with phlegmon. Negative for  osteomyelitis or septic joint. Negative for meniscal or ligament tear. Electronically Signed   By: Drusilla Kanner M.D.   On: 04/05/2017 12:49   Dg Knee Complete 4 Views Right  Result Date: 04/05/2017 CLINICAL DATA:  Recent soft tissue laceration with pain and swelling EXAM: RIGHT KNEE - COMPLETE 4+ VIEW COMPARISON:  None. FINDINGS: No acute fracture or dislocation is noted. No joint effusion is seen. Considerable soft tissue edema is noted area anterior to the patella consistent with the recent injury. No radiopaque foreign body is noted. IMPRESSION: Soft tissue changes without acute bony abnormality. Electronically Signed   By: [name] M.D.   On: 04/05/2017 09:58  Scheduled Meds: . nicotine  14 mg Transdermal Q24H   Continuous Infusions: . piperacillin-tazobactam (ZOSYN)  IV 3.375 g (04/06/17 1151)  . vancomycin Stopped (04/06/17 0601)     LOS: 1 day   Time spent: 35 minutes   Debbora Presto, MD [facility] Pager [phone]  If 7PM-7AM, please contact night-coverage [email or web address] Password [email or web address] 04/06/2017, 3:55 PM

## 2017-04-06 NOTE — Progress Notes (Signed)
Patient ID: Alex Hurst, male   DOB: [birth year], 41 y.o.   MRN: [id number]   LOS: 1 day   Subjective: Pain improved but swelling about the same   Objective: Vital signs in last 24 hours: Temp:  [98.3 F (36.8 C)-99.1 F (37.3 C)] 98.3 F (36.8 C) (07/06 40980613) Pulse Rate:  [75-92] 85 (07/06 0613) Resp:  [16-18] 18 (07/06 0613) BP: (113-130)/(71-100) 113/71 (07/06 0613) SpO2:  [95 %-99 %] 96 % (07/06 0613)     Laboratory  CBC  Recent Labs  04/05/17 0932 04/06/17 0405  WBC 19.7* 16.4*  HGB 15.1 13.5  HCT 44.8 42.2  PLT 320 324   BMET  Recent Labs  04/05/17 0932 04/06/17 0405  NA 137 135  K 3.9 3.5  CL 103 101  CO2 25 29  GLUCOSE 107* 99  BUN 5* 6  CREATININE 0.84 0.90  CALCIUM 8.7* 8.5*     Physical Exam General appearance: alert and no distress  RLE Erythema improved, wick in place    Assessment/Plan: RLE cellulitis/abscess -- Continue IV abx    Freeman CaldronMichael J. Virgie Chery, PA-C Orthopedic Surgery [phone] 04/06/2017

## 2017-04-06 NOTE — Plan of Care (Signed)
Problem: Safety: Goal: Ability to remain free from injury will improve Outcome: Progressing No falls during this admission. Call bell within reach. Bed in low and locked position. Patient alert and oriented. Clean and clear environment maintained. Nonskid footwear being utilized. 3/4 siderails in place. Patient verbalized understanding of safety instruction.  Problem: Pain Management: Goal: General experience of comfort will improve Outcome: Progressing Pain being managed with PRN pain medication. Vital signs are stable. No facial grimacing or moaning evident.

## 2017-04-07 LAB — BASIC METABOLIC PANEL
Anion gap: 7 (ref 5–15)
BUN: 5 mg/dL — AB (ref 6–20)
CO2: 27 mmol/L (ref 22–32)
CREATININE: 0.8 mg/dL (ref 0.61–1.24)
Calcium: 8.8 mg/dL — ABNORMAL LOW (ref 8.9–10.3)
Chloride: 103 mmol/L (ref 101–111)
GFR calc Af Amer: >60 mL/min (ref >60)
Glucose, Bld: 87 mg/dL (ref 65–99)
Potassium: 3.5 mmol/L (ref 3.5–5.1)
SODIUM: 137 mmol/L (ref 135–145)

## 2017-04-07 LAB — AEROBIC CULTURE  (SUPERFICIAL SPECIMEN)

## 2017-04-07 LAB — AEROBIC CULTURE W GRAM STAIN (SUPERFICIAL SPECIMEN)

## 2017-04-07 LAB — CBC
HCT: 42.1 % (ref 39.0–52.0)
Hemoglobin: 13.8 g/dL (ref 13.0–17.0)
MCH: 30.3 pg (ref 26.0–34.0)
MCHC: 32.8 g/dL (ref 30.0–36.0)
MCV: 92.5 fL (ref 78.0–100.0)
PLATELETS: 372 10*3/uL (ref 150–400)
RBC: 4.55 MIL/uL (ref 4.22–5.81)
RDW: 13.2 % (ref 11.5–15.5)
WBC: 15.9 10*3/uL — ABNORMAL HIGH (ref 4.0–10.5)

## 2017-04-07 MED ORDER — SULFAMETHOXAZOLE-TRIMETHOPRIM 800-160 MG PO TABS: 1.0000 | ORAL_TABLET | Freq: Two times a day (BID) | ORAL | 0 refills | Status: DC

## 2017-04-07 MED ORDER — OXYCODONE-ACETAMINOPHEN 5-325 MG PO TABS: 1.0000 | ORAL_TABLET | ORAL | 0 refills | Status: DC | PRN

## 2017-04-07 NOTE — Discharge Instructions (Signed)

## 2017-04-07 NOTE — Progress Notes (Signed)
Alex DacostaJoshua L Hurst  MRN: [id number] DOB/Age: Nov 12, 1975 40 y.o. Physician: Jacquelyne BalintKevin Supple,MD Procedure:      Subjective: Feeling better and anxious to go home. Bactrim ordered after culture report showed staph with sensitivities  Vital Signs Temp:  [97.8 F (36.6 C)-99.3 F (37.4 C)] 97.8 F (36.6 C) (07/07 0601) Pulse Rate:  [81-95] 81 (07/07 0601) Resp:  [18] 18 (07/07 0601) BP: (115-132)/(68-84) 115/79 (07/07 0601) SpO2:  [97 %-99 %] 97 % (07/07 0601)  Lab Results  Recent Labs  04/06/17 0405 04/07/17 0516  WBC 16.4* 15.9*  HGB 13.5 13.8  HCT 42.2 42.1  PLT 324 372   BMET  Recent Labs  04/06/17 0405 04/07/17 0516  NA 135 137  K 3.5 3.5  CL 101 103  CO2 29 27  GLUCOSE 99 87  BUN 6 5*  CREATININE 0.90 0.80  CALCIUM 8.5* 8.8*   No results found for: INR   Exam Knee dressing removed and packing also removed. No purulence just bloody drainage. erythema improving with moderate swelling remaining        Plan DC home Follow up Wed as outpatient  [facility] IncHUFORD,Katilynn Sinkler PA-C  for Dr.Kevin Supple 04/07/2017, 10:08 AM Contact # [phone]

## 2017-04-07 NOTE — Discharge Summary (Addendum)
Physician Discharge Summary  Alex DacostaJoshua L Hurst XBJ:[id number] DOB: 1976/02/26 DOA: 04/05/2017  PCP: [name], [name], MD  Admit date: 04/05/2017 Discharge date: 04/07/2017  Recommendations for Outpatient Follow-up:  1. Pt will need to follow up with PCP in 1-2 weeks post discharge 2. Bactrim 10 more days post discharge   Discharge Diagnoses:  Principal Problem:   Abscess of knee, right Active Problems:   Leukocytosis  Discharge Condition: Stable  Diet recommendation: Heart healthy diet discussed in details   Brief Narrative:  41 yo male presented with several days duration of progressively worsening RLE pain and swelling associated with erythema and TTP.   Assessment & Plan:   SIRS secondary to Right knee pre-patellar abscess, RLE cellulitis  - s/p right knee I&D of the right knee prepatellar bursa abscess - swelling and erythema much better - has been on vanc and zosyn but wants to go home today, ortho cleared for d/c - will place on Bactrim - pt made aware that wound cultures still pending and based on sensitivity report we may need to change ABX and he has verbalized understanding   Active tobacco use - counseled on cessation   DVT prophylaxis: SCD's Code Status: Full  Family Communication: Patient at bedside  Disposition Plan: home  Consultants:   Ortho   Procedures:   Right knee abscess I&D 7/5  Antimicrobials:   Vancomycin 7/5 --> 7/7  Zosyn 7/5 --> 7/7  Bactrim 7/7 --> 10 more days   Procedures/Studies: Mr Knee Right W Wo Contrast  Result Date: 04/05/2017 CLINICAL DATA:  History of laceration on the right knee by a sea shell mid June,2018. The patient developed redness, swelling, fever and chills over the past week. EXAM: MRI OF THE RIGHT KNEE WITHOUT AND WITH CONTRAST TECHNIQUE: Multiplanar, multisequence MR imaging of the knee was performed before and after the administration of intravenous contrast. CONTRAST:  17 ml MULTIHANCE GADOBENATE  DIMEGLUMINE 529 MG/ML IV SOLN COMPARISON:  Plain films right knee this same day. FINDINGS: MENISCI Medial meniscus:  Intact. Lateral meniscus:  Intact. LIGAMENTS Cruciates:  Intact. Collaterals:  Intact. CARTILAGE Patellofemoral:  Normal. Medial:  Normal. Lateral:  Normal. Joint:  Trace amount of joint fluid. Popliteal Fossa:  No Baker's cyst. Extensor Mechanism:  Intact. Bones:  Normal marrow signal throughout. Other: Intense subcutaneous edema and enhancement are present about the knee diffusely. A subcutaneous abscess measuring 0.9 cm AP x 2.8 cm transverse x 1.8 cm craniocaudal is seen in the midline just below the inferior pole of the patella. A second somewhat focal area of fluid worrisome for abscess in the subcutaneous tissues is seen along the medial aspect of the proximal tibia and measures 1.9 cm AP by 0.4 cm transverse by 4.3 cm craniocaudal. More ill-defined areas of fluid in subcutaneous tissues about the knee consistent with phlegmon. IMPRESSION: Intense cellulitis about the knee with a subcutaneous abscess just off the inferior pole of the patella and along the medial tibia as described above. There may be a second smaller abscess in the subcutaneous tissues along the medial aspect of the proximal tibia. Multiple additional smaller areas of ill defined fluid are identified compatible with phlegmon. Negative for osteomyelitis or septic joint. Negative for meniscal or ligament tear. Electronically Signed   By: [name]  Dalessio M.D.   On: 04/05/2017 12:49   Dg Knee Complete 4 Views Right  Result Date: 04/05/2017 CLINICAL DATA:  Recent soft tissue laceration with pain and swelling EXAM: RIGHT KNEE - COMPLETE 4+ VIEW COMPARISON:  None. FINDINGS: No acute fracture or dislocation is noted. No joint effusion is seen. Considerable soft tissue edema is noted area anterior to the patella consistent with the recent injury. No radiopaque foreign body is noted. IMPRESSION: Soft tissue changes without acute bony  abnormality. Electronically Signed   By: [name] M.D.   On: 04/05/2017 09:58     Discharge Exam: Vitals:   04/06/17 2016 04/07/17 0601  BP: 132/84 115/79  Pulse: 95 81  Resp: 18 18  Temp: 99.3 F (37.4 C) 97.8 F (36.6 C)   Vitals:   04/06/17 0613 04/06/17 1500 04/06/17 2016 04/07/17 0601  BP: 113/71 120/68 132/84 115/79  Pulse: 85 82 95 81  Resp: 18 18 18 18   Temp: 98.3 F (36.8 C) 98.2 F (36.8 C) 99.3 F (37.4 C) 97.8 F (36.6 C)  TempSrc: Oral Oral Oral Oral  SpO2: 96% 98% 99% 97%  Weight:      Height:        General: Pt is alert, follows commands appropriately, not in acute distress Cardiovascular: Regular rate and rhythm, S1/S2 +, no murmurs, no rubs, no gallops Respiratory: Clear to auscultation bilaterally, no wheezing, no crackles, no rhonchi Abdominal: Soft, non tender, non distended, bowel sounds +, no guarding Extremities: R LE edema still present but improved, less erythema and less TTP Neuro: Grossly nonfocal  Discharge Instructions  Discharge Instructions    Diet - low sodium heart healthy    Complete by:  As directed    Increase activity slowly    Complete by:  As directed      Allergies as of 04/07/2017   No Known Allergies     Medication List    TAKE these medications   ibuprofen 200 MG tablet Commonly known as:  ADVIL,MOTRIN Take 200 mg by mouth every 6 (six) hours as needed for moderate pain.   oxyCODONE-acetaminophen 5-325 MG tablet Commonly known as:  PERCOCET/ROXICET Take 1-2 tablets by mouth every 3 (three) hours as needed for moderate pain.   pseudoephedrine 30 MG tablet Commonly known as:  SUDAFED Take 30 mg by mouth every 4 (four) hours as needed for congestion.   sulfamethoxazole-trimethoprim 800-160 MG tablet Commonly known as:  BACTRIM DS,SEPTRA DS Take 1 tablet by mouth 2 (two) times daily.      Follow-up Information    [name], MD Follow up.   Specialty:  Internal Medicine Contact information: [id number] [place] [address] [phone]            The results of significant diagnostics from this hospitalization (including imaging, microbiology, ancillary and laboratory) are listed below for reference.     Microbiology: Recent Results (from the past 240 hour(s))  Blood culture (routine x 2)     Status: None (Preliminary result)   Collection Time: 04/05/17  9:38 AM  Result Value Ref Range Status   Specimen Description BLOOD LEFT ANTECUBITAL  Final   Special Requests   Final    BOTTLES DRAWN AEROBIC AND ANAEROBIC Blood Culture adequate volume   Culture NO GROWTH 1 DAY  Final   Report Status PENDING  Incomplete  Blood culture (routine x 2)     Status: None (Preliminary result)   Collection Time: 04/05/17 10:30 AM  Result Value Ref Range Status   Specimen Description BLOOD RIGHT ANTECUBITAL  Final   Special Requests   Final    BOTTLES DRAWN AEROBIC AND ANAEROBIC Blood Culture adequate volume   Culture NO  GROWTH 1 DAY  Final   Report Status PENDING  Incomplete  Wound or Superficial Culture     Status: None (Preliminary result)   Collection Time: 04/05/17  2:00 PM  Result Value Ref Range Status   Specimen Description WOUND KNEE  Final   Special Requests SOFT TISSUE ABSCESS  Final   Gram Stain   Final    RARE WBC PRESENT, PREDOMINANTLY PMN RARE GRAM POSITIVE COCCI IN CLUSTERS RARE GRAM NEGATIVE RODS    Culture RARE STAPHYLOCOCCUS AUREUS  Final   Report Status PENDING  Incomplete     Labs: Basic Metabolic Panel:  Recent Labs Lab 04/05/17 0932 04/06/17 0405 04/07/17 0516  NA 137 135 137  K 3.9 3.5 3.5  CL 103 101 103  CO2 25 29 27   GLUCOSE 107* 99 87  BUN 5* 6 5*  CREATININE 0.84 0.90 0.80  CALCIUM 8.7* 8.5* 8.8*   CBC:  Recent Labs Lab 04/05/17 0932 04/06/17 0405 04/07/17 0516  WBC 19.7* 16.4* 15.9*  NEUTROABS 16.5*  --   --   HGB 15.1 13.5 13.8  HCT 44.8 42.2 42.1  MCV 92.0 94.2 92.5  PLT 320 324 372    SIGNED: Time  coordinating discharge: 45 minutes  Debbora Presto, MD  [facility] 04/07/2017, 9:08 AM Pager [phone]  If 7PM-7AM, please contact night-coverage [email or web address] Password [email or web address]

## 2017-04-10 LAB — CULTURE, BLOOD (ROUTINE X 2)
Culture: NO GROWTH
Culture: NO GROWTH
Special Requests: ADEQUATE
Special Requests: ADEQUATE

## 2017-04-17 ENCOUNTER — Encounter: Payer: Self-pay | Admitting: Family Medicine

## 2017-04-17 ENCOUNTER — Ambulatory Visit (INDEPENDENT_AMBULATORY_CARE_PROVIDER_SITE_OTHER): Payer: 59 | Admitting: Family Medicine

## 2017-04-17 VITALS — BP 110/76 | HR 94 | Temp 98.4°F | Resp 18 | Ht 66.0 in | Wt 186.0 lb

## 2017-04-17 DIAGNOSIS — Z23 Encounter for immunization: Secondary | ICD-10-CM | POA: Diagnosis not present

## 2017-04-17 DIAGNOSIS — Z09 Encounter for follow-up examination after completed treatment for conditions other than malignant neoplasm: Secondary | ICD-10-CM | POA: Diagnosis not present

## 2017-04-17 MED ORDER — SULFAMETHOXAZOLE-TRIMETHOPRIM 800-160 MG PO TABS: 1.0000 | ORAL_TABLET | Freq: Two times a day (BID) | ORAL | 0 refills | Status: DC

## 2017-04-17 NOTE — Progress Notes (Signed)
Subjective:    Patient ID: Alex Hurst, male    DOB: July 02, 1976, 41 y.o.   MRN: [id number]  HPI Patient is a very pleasant 41 y/o WM here to establish care and to follow up from his recent hospitalization.  I have copied relevant portions of the discharge summary below: Admit date: 04/05/2017 Discharge date: 04/07/2017  Recommendations for Outpatient Follow-up:  1. Pt will need to follow up with PCP in 1-2 weeks post discharge 2. Bactrim 10 more days post discharge   Discharge Diagnoses:  Principal Problem:   Abscess of knee, right Active Problems:   Leukocytosis  Discharge Condition: Stable  Diet recommendation: Heart healthy diet discussed in details   Brief Narrative: 41 yo male presented with several days duration of progressively worsening RLE pain and swelling associated with erythema and TTP.   Assessment & Plan:  SIRS secondary to Right knee pre-patellar abscess, RLE cellulitis  - s/p right knee I&D of the right knee prepatellar bursa abscess - swelling and erythema much better - has been on vanc and zosyn but wants to go home today, ortho cleared for d/c - will place on Bactrim - pt made aware that wound cultures still pending and based on sensitivity report we may need to change ABX and he has verbalized understanding   Active tobacco use - counseled on cessation  Patient is here today for follow-up. I reviewed the results of his MRI. Visibly today on his exam his knee looks much better than Naprosyn that was seen on MRI. There is still some residual erythema overlying the patella. There is slight subcutaneous edema but this is minimal. There is also some warmth surrounding the patella. However the erythema that was tracking down over the medial portion of his tibia and all around his knee has completely subsided. He states that the pain is much better. He remains afebrile. He completed his antibiotics last evening. However I'm concerned there may be some  residual cellulitis. There are no palpable fluid collections the need to be drained today. He does still have some pain and stiffness with flexion of the knee in addition to the erythema and warmth.  No past medical history on file.  No past surgical history on file. No current outpatient prescriptions on file prior to visit.   No current facility-administered medications on file prior to visit.    No Known Allergies Social History   Social History  . Marital status: Married    Spouse name: N/A  . Number of children: N/A  . Years of education: N/A   Occupational History  . Not on file.   Social History Main Topics  . Smoking status: Current Every Day Smoker  . Smokeless tobacco: Never Used  . Alcohol use Yes  . Drug use: No  . Sexual activity: Not on file   Other Topics Concern  . Not on file   Social History Narrative  . No narrative on file     Review of Systems  All other systems reviewed and are negative.      Objective:   Physical Exam  Constitutional: He appears well-developed and well-nourished.  Cardiovascular: Normal rate, regular rhythm and normal heart sounds.   Pulmonary/Chest: Effort normal and breath sounds normal. No respiratory distress. He has no wheezes. He has no rales.  Abdominal: Soft. Bowel sounds are normal.  Musculoskeletal:       Right knee: He exhibits laceration and erythema. He exhibits normal range of motion, no swelling, no  effusion, no ecchymosis and no deformity. No medial joint line, no lateral joint line, no MCL and no LCL tenderness noted.       Legs: Skin: Skin is warm. There is erythema.  Vitals reviewed.         Assessment & Plan:  Hospital discharge follow-up - Plan: CBC with Differential/Platelet, COMPLETE METABOLIC PANEL WITH GFR  Need for Tdap vaccination - Plan: Tdap vaccine greater than or equal to 7yo IM  Patient appears much improved from the reports sent to me from the hospital. However I believe there may  be some residual infection and therefore I would like to lengthen the duration of antibiotics.  I have asked him to extend Bactrim double strength tablets twice a day for 7 more days. I also like to see the patient back for complete physical exam at his earliest convenience. I will monitor a CBC today to evaluate for persistent leukocytosis. I will also check a CMP today. Patient did not receive a tetanus shot in the hospital and we gave him a tetanus shot as well.

## 2017-04-18 LAB — COMPLETE METABOLIC PANEL WITH GFR
ALBUMIN: 3.9 g/dL (ref 3.6–5.1)
ALK PHOS: 131 U/L — AB (ref 40–115)
ALT: 42 U/L (ref 9–46)
AST: 25 U/L (ref 10–40)
BILIRUBIN TOTAL: 0.3 mg/dL (ref 0.2–1.2)
BUN: 16 mg/dL (ref 7–25)
CO2: 25 mmol/L (ref 20–31)
CREATININE: 0.98 mg/dL (ref 0.60–1.35)
Calcium: 9.3 mg/dL (ref 8.6–10.3)
Chloride: 104 mmol/L (ref 98–110)
Glucose, Bld: 100 mg/dL — ABNORMAL HIGH (ref 70–99)
Potassium: 4.5 mmol/L (ref 3.5–5.3)
Sodium: 139 mmol/L (ref 135–146)
Total Protein: 6.5 g/dL (ref 6.1–8.1)

## 2017-04-18 LAB — CBC WITH DIFFERENTIAL/PLATELET
BASOS ABS: 81 {cells}/uL (ref 0–200)
BASOS PCT: 1 %
EOS ABS: 162 {cells}/uL (ref 15–500)
Eosinophils Relative: 2 %
HEMATOCRIT: 45.5 % (ref 38.5–50.0)
Hemoglobin: 15 g/dL (ref 13.0–17.0)
Lymphocytes Relative: 31 %
Lymphs Abs: 2511 cells/uL (ref 850–3900)
MCH: 30.5 pg (ref 27.0–33.0)
MCHC: 33 g/dL (ref 32.0–36.0)
MCV: 92.5 fL (ref 80.0–100.0)
MONO ABS: 567 {cells}/uL (ref 200–950)
MPV: 9.2 fL (ref 7.5–12.5)
Monocytes Relative: 7 %
NEUTROS ABS: 4779 {cells}/uL (ref 1500–7800)
Neutrophils Relative %: 59 %
Platelets: 509 10*3/uL — ABNORMAL HIGH (ref 140–400)
RBC: 4.92 MIL/uL (ref 4.20–5.80)
RDW: 13.6 % (ref 11.0–15.0)
WBC: 8.1 10*3/uL (ref 3.8–10.8)

## 2017-04-19 ENCOUNTER — Encounter: Payer: Self-pay | Admitting: Family Medicine

## 2017-05-02 DIAGNOSIS — M71161 Other infective bursitis, right knee: Secondary | ICD-10-CM | POA: Diagnosis not present

## 2017-05-02 DIAGNOSIS — L03115 Cellulitis of right lower limb: Secondary | ICD-10-CM | POA: Diagnosis not present

## 2017-05-02 DIAGNOSIS — M7041 Prepatellar bursitis, right knee: Secondary | ICD-10-CM | POA: Diagnosis not present

## 2017-05-15 ENCOUNTER — Other Ambulatory Visit: Payer: Self-pay | Admitting: Family Medicine

## 2017-05-15 MED ORDER — CRISABOROLE 2 % EX OINT: 1.0000 "application " | TOPICAL_OINTMENT | Freq: Every day | CUTANEOUS | 2 refills | Status: DC

## 2017-09-21 ENCOUNTER — Telehealth: Payer: Self-pay | Admitting: Family Medicine

## 2017-09-21 MED ORDER — AMOXICILLIN 875 MG PO TABS: 875.0000 mg | ORAL_TABLET | Freq: Two times a day (BID) | ORAL | 0 refills | Status: DC

## 2017-09-21 NOTE — Telephone Encounter (Signed)
Pt's wife sent a message stating that pt had a sinus infection and coughing up a lot of mucus and wanted to know if we could call something in for him. Per Dr. Tanya NonesPickard ok to send in Amoxicillin. Med sent to pharm.

## 2018-01-16 ENCOUNTER — Ambulatory Visit: Payer: 59 | Admitting: Family Medicine

## 2018-01-16 ENCOUNTER — Other Ambulatory Visit: Payer: Self-pay

## 2018-01-16 ENCOUNTER — Encounter: Payer: Self-pay | Admitting: Family Medicine

## 2018-01-16 VITALS — BP 108/68 | HR 64 | Temp 98.5°F | Resp 12 | Ht 66.0 in | Wt 185.0 lb

## 2018-01-16 DIAGNOSIS — S81811A Laceration without foreign body, right lower leg, initial encounter: Secondary | ICD-10-CM | POA: Diagnosis not present

## 2018-01-16 MED ORDER — SULFAMETHOXAZOLE-TRIMETHOPRIM 800-160 MG PO TABS: 1.0000 | ORAL_TABLET | Freq: Two times a day (BID) | ORAL | 0 refills | Status: DC

## 2018-01-16 MED ORDER — HYDROCODONE-ACETAMINOPHEN 5-325 MG PO TABS: 1.0000 | ORAL_TABLET | Freq: Four times a day (QID) | ORAL | 0 refills | Status: DC | PRN

## 2018-01-16 NOTE — Patient Instructions (Signed)
Keep area clean and bandaged  Take antibiotics Take pain meds as needed F/U if not improving

## 2018-01-16 NOTE — Progress Notes (Signed)
   Subjective:    Patient ID: Alex DacostaJoshua L Hurst, male    DOB: [birth year], 42 y.o.   MRN: [id number]  Patient presents for Laceration (x1 day- 4 cm laceration to inner R knee- cut leg on sewer tank- last TDAP 2018)  Patient here with laceration to his right leg medial aspect of his knee.  This occurred last night around 8 PM.  States that he was trying to cut something on his septic tank he did not have the safety on the grinder and it bounced off hit the ground and then went through his pants and cut the leg.  He cleaned it with iodine last night and put liquid Band-Aid and Steri-Strips on.  He has not had any fever he does have some pain.  He has not had any drainage.  He does have history of previous infections of the right knee after injury last summer.  His tetanus is up-to-date was given in 2018 during that event.   Review Of Systems:  GEN- denies fatigue, fever, weight loss,weakness, recent illness HEENT- denies eye drainage, change in vision, nasal discharge, CVS- denies chest pain, palpitations RESP- denies SOB, cough, wheeze ABD- denies N/V, change in stools, abd pain GU- denies dysuria, hematuria, dribbling, incontinence MSK- denies joint pain, muscle aches, injury Neuro- denies headache, dizziness, syncope, seizure activity       Objective:    BP 108/68   Pulse 64   Temp 98.5 F (36.9 C) (Oral)   Resp 12   Ht 5\' 6"  (1.676 m)   Wt 185 lb (83.9 kg)   BMI 29.86 kg/m  GEN- NAD, alert and oriented x3 Right leg- 4cm linear superficial laceration, mild erythema surrounding, no odor, no drainage, half of liquid bandaid removed MSK- FROM of knee, no effusion of knee, no warmth         Assessment & Plan:      Problem List Items Addressed This Visit    None    Visit Diagnoses    Laceration of right lower leg, initial encounter    -  Primary   More superficial wound, but dirty wound, the liquid bandaid closed the upper 1.5cm of wound,no active bleeding, irrigated at  bedside, will not suture due to nature of wound and superficial Given bactrim, norco #20 He will call if this does not heal within 1 week      Note: This dictation was prepared with Dragon dictation along with smaller phrase technology. Any transcriptional errors that result from this process are unintentional.

## 2018-04-30 DIAGNOSIS — M7541 Impingement syndrome of right shoulder: Secondary | ICD-10-CM | POA: Diagnosis not present

## 2018-07-24 ENCOUNTER — Encounter: Payer: Self-pay | Admitting: Family Medicine

## 2018-07-24 ENCOUNTER — Ambulatory Visit: Payer: 59 | Admitting: Family Medicine

## 2018-07-24 ENCOUNTER — Other Ambulatory Visit: Payer: Self-pay

## 2018-07-24 VITALS — BP 136/74 | HR 70 | Temp 98.7°F | Resp 14 | Ht 66.0 in | Wt 188.0 lb

## 2018-07-24 DIAGNOSIS — J0141 Acute recurrent pansinusitis: Secondary | ICD-10-CM

## 2018-07-24 MED ORDER — PREDNISONE 20 MG PO TABS: 40.0000 mg | ORAL_TABLET | Freq: Every day | ORAL | 0 refills | Status: AC

## 2018-07-24 MED ORDER — DOXYCYCLINE HYCLATE 100 MG PO TABS: 100.0000 mg | ORAL_TABLET | Freq: Two times a day (BID) | ORAL | 0 refills | Status: AC

## 2018-07-24 NOTE — Progress Notes (Signed)
Patient ID: Alex Hurst, male    DOB: 1976/01/22, 42 y.o.   MRN: [id number]  PCP: Donita Brooks, MD  Chief Complaint  Patient presents with  . Illness    x1 week- nasaal congestion, sinus pressure, post nasal drip, productive cough with green colored mucus    Subjective:   Alex Hurst is a 42 y.o. male, presents to clinic with CC of 1 week of nasal congestion, nasal drainage, sinus pain and pressure that radiates across his forehead, and his cheeks and behind his eyes.  Drainage was initially clear and then it suddenly worsened and became purulent and green with increased pain and pressure.  A history of recurrent sinusitis.  He has been to ENT in the past.  He is on steroid nasal sprays, antihistamines and take Sudafed when his symptoms worsen.  Pain is moderate to severe, has been worsening.  He did develop some postnasal drip and cough that is only productive usually first thing in the morning and otherwise not very bothersome.  He denies any fever, chills, sweats, shortness of breath, wheeze, chest pain   Patient Active Problem List   Diagnosis Date Noted  . Leukocytosis 04/05/2017     Prior to Admission medications   Not on File     No Known Allergies   Family History  Problem Relation Age of Onset  . Lung cancer Maternal Grandmother   . Lung cancer Paternal Grandmother      Social History   Socioeconomic History  . Marital status: Married    Spouse name: Not on file  . Number of children: Not on file  . Years of education: Not on file  . Highest education level: Not on file  Occupational History  . Not on file  Social Needs  . Financial resource strain: Not on file  . Food insecurity:    Worry: Not on file    Inability: Not on file  . Transportation needs:    Medical: Not on file    Non-medical: Not on file  Tobacco Use  . Smoking status: Current Every Day Smoker  . Smokeless tobacco: Never Used  Substance and Sexual Activity  .  Alcohol use: Yes  . Drug use: No  . Sexual activity: Not on file  Lifestyle  . Physical activity:    Days per week: Not on file    Minutes per session: Not on file  . Stress: Not on file  Relationships  . Social connections:    Talks on phone: Not on file    Gets together: Not on file    Attends religious service: Not on file    Active member of club or organization: Not on file    Attends meetings of clubs or organizations: Not on file    Relationship status: Not on file  . Intimate partner violence:    Fear of current or ex partner: Not on file    Emotionally abused: Not on file    Physically abused: Not on file    Forced sexual activity: Not on file  Other Topics Concern  . Not on file  Social History Narrative  . Not on file     Review of Systems  Constitutional: Negative.   HENT: Negative.   Eyes: Negative.   Respiratory: Negative.   Cardiovascular: Negative.   Gastrointestinal: Negative.   Endocrine: Negative.   Genitourinary: Negative.   Musculoskeletal: Negative.   Skin: Negative.   Allergic/Immunologic: Negative.   Neurological:  Negative.   Hematological: Negative.   Psychiatric/Behavioral: Negative.   All other systems reviewed and are negative.      Objective:    Vitals:   07/24/18 1226  BP: 136/74  Pulse: 70  Resp: 14  Temp: 98.7 F (37.1 C)  TempSrc: Oral  SpO2: 97%  Weight: 188 lb (85.3 kg)  Height: 5\' 6"  (1.676 m)      Physical Exam  Constitutional: He appears well-developed and well-nourished. No distress.  HENT:  Head: Normocephalic and atraumatic.  Right Ear: Tympanic membrane, external ear and ear canal normal.  Left Ear: Tympanic membrane, external ear and ear canal normal.  Nose: Mucosal edema and rhinorrhea present. Right sinus exhibits maxillary sinus tenderness and frontal sinus tenderness. Left sinus exhibits frontal sinus tenderness.  Mouth/Throat: Uvula is midline, oropharynx is clear and moist and mucous membranes are  normal. No oropharyngeal exudate, posterior oropharyngeal edema, posterior oropharyngeal erythema or tonsillar abscesses.  Diffusely edematous nasal mucosa and enlarged nasal turbinates bilaterally, right nare more congested and obstructed than the left but both fairly severe, mucosa is erythematous with copious discharge  Eyes: Conjunctivae are normal. Right eye exhibits no discharge. Left eye exhibits no discharge.  Neck: Normal range of motion. Neck supple. No tracheal deviation present.  Cardiovascular: Normal rate, regular rhythm, normal heart sounds and intact distal pulses. Exam reveals no gallop and no friction rub.  No murmur heard. Pulmonary/Chest: Effort normal and breath sounds normal. No stridor. No respiratory distress. He has no wheezes. He has no rales.  Abdominal: Soft. Bowel sounds are normal.  Musculoskeletal: Normal range of motion.  Lymphadenopathy:    He has no cervical adenopathy.  Neurological: He is alert. He exhibits normal muscle tone. Coordination normal.  Skin: Skin is warm and dry. Capillary refill takes less than 2 seconds. No rash noted. He is not diaphoretic.  Psychiatric: He has a normal mood and affect. His behavior is normal.  Nursing note and vitals reviewed.         Assessment & Plan:      ICD-10-CM   1. Acute recurrent pansinusitis J01.41 doxycycline (VIBRA-TABS) 100 MG tablet    predniSONE (DELTASONE) 20 MG tablet    Patient presentation history and exam consistent with acute bacterial sinusitis that is reportedly recurrent in nature.  Patient was instructed to stop the Sudafed, start doxycycline and take prednisone with his other daily meds for rhinosinusitis/allergies.  Follow-up if not improving.  Also follow-up if he feels like he would like to go back to see ENT   Danelle Berry, PA-C 07/24/18 2:08 PM

## 2018-09-02 ENCOUNTER — Ambulatory Visit: Payer: 59 | Admitting: Family Medicine

## 2018-09-02 ENCOUNTER — Encounter: Payer: Self-pay | Admitting: Family Medicine

## 2018-09-02 VITALS — BP 134/92 | HR 104 | Temp 97.9°F | Resp 16 | Ht 66.0 in | Wt 189.0 lb

## 2018-09-02 DIAGNOSIS — M545 Low back pain, unspecified: Secondary | ICD-10-CM

## 2018-09-02 MED ORDER — OXYCODONE-ACETAMINOPHEN 5-325 MG PO TABS: 1.0000 | ORAL_TABLET | ORAL | 0 refills | Status: DC | PRN

## 2018-09-02 MED ORDER — CYCLOBENZAPRINE HCL 10 MG PO TABS: 10.0000 mg | ORAL_TABLET | Freq: Three times a day (TID) | ORAL | 0 refills | Status: DC | PRN

## 2018-09-02 MED ORDER — PREDNISONE 20 MG PO TABS: ORAL_TABLET | ORAL | 0 refills | Status: DC

## 2018-09-02 NOTE — Progress Notes (Signed)
Subjective:    Patient ID: Alex Hurst, male    DOB: 01-02-1976, 42 y.o.   MRN: [id number]  HPI  Starting Thursday, the patient developed low back pain in the center roughly the level of L4-L5.  He denies any specific injury however the pain has gradually intensified.  It is not radiating down either leg.  He denies any saddle anesthesia.  He denies any bowel or bladder incontinence.  He denies any leg weakness.  He denies any hematuria or dysuria or frequency or urgency.  He denies any fevers or chills however the pain is moderate to severe.  He is unable to get comfortable.  He has been taking 800 mg of ibuprofen every 8 hours with no relief.  On exam he has palpable muscle spasms along the lumbar paraspinal muscles bilaterally.  There is no spinous process tenderness to palpation however the patient has extremely limited range of motion due to severe pain with forward flexion and hyperextension.  No past medical history on file. No past surgical history on file. Current Outpatient Medications on File Prior to Visit  Medication Sig Dispense Refill  . fluticasone (FLONASE) 50 MCG/ACT nasal spray Place 2 sprays into both nostrils daily.     No current facility-administered medications on file prior to visit.    No Known Allergies Social History   Socioeconomic History  . Marital status: Married    Spouse name: Not on file  . Number of children: Not on file  . Years of education: Not on file  . Highest education level: Not on file  Occupational History  . Not on file  Social Needs  . Financial resource strain: Not on file  . Food insecurity:    Worry: Not on file    Inability: Not on file  . Transportation needs:    Medical: Not on file    Non-medical: Not on file  Tobacco Use  . Smoking status: Current Every Day Smoker  . Smokeless tobacco: Never Used  Substance and Sexual Activity  . Alcohol use: Yes  . Drug use: No  . Sexual activity: Not on file  Lifestyle  .  Physical activity:    Days per week: Not on file    Minutes per session: Not on file  . Stress: Not on file  Relationships  . Social connections:    Talks on phone: Not on file    Gets together: Not on file    Attends religious service: Not on file    Active member of club or organization: Not on file    Attends meetings of clubs or organizations: Not on file    Relationship status: Not on file  . Intimate partner violence:    Fear of current or ex partner: Not on file    Emotionally abused: Not on file    Physically abused: Not on file    Forced sexual activity: Not on file  Other Topics Concern  . Not on file  Social History Narrative  . Not on file      Review of Systems  All other systems reviewed and are negative.      Objective:   Physical Exam  Constitutional: He appears well-developed and well-nourished. No distress.  Cardiovascular: Normal rate, regular rhythm, normal heart sounds and intact distal pulses. Exam reveals no gallop and no friction rub.  No murmur heard. Pulmonary/Chest: Effort normal and breath sounds normal. No stridor. No respiratory distress. He has no wheezes. He has no  rales.  Musculoskeletal:       Lumbar back: He exhibits decreased range of motion, tenderness, pain and spasm. He exhibits no bony tenderness, no swelling, no edema and no deformity.       Back:  Skin: He is not diaphoretic.  Vitals reviewed.         Assessment & Plan:  Acute midline low back pain, unspecified whether sciatica present - Plan: DG Lumbar Spine Complete  I believe the patient is torn a muscle in his lower back or possibly has a herniated disc.  Recommended a prednisone taper pack for inflammation and swelling, use Flexeril 10 mg every 8 hours as needed for muscle spasm, recommended range of motion exercises for flexibility, and use Percocet 5/325 1 p.o. every 6 hours as needed severe pain.  Also obtain an x-ray of the lower back.  Recheck in 1 week if no  better or sooner if worse

## 2018-09-03 ENCOUNTER — Ambulatory Visit
Admission: RE | Admit: 2018-09-03 | Discharge: 2018-09-03 | Disposition: A | Discharge status: Home / Self Care | Payer: 59 | Source: Ambulatory Visit | Attending: Family Medicine | Admitting: Family Medicine

## 2018-09-03 DIAGNOSIS — M545 Low back pain, unspecified: Secondary | ICD-10-CM

## 2018-09-04 ENCOUNTER — Encounter: Payer: Self-pay | Admitting: Family Medicine

## 2018-09-04 ENCOUNTER — Telehealth: Payer: Self-pay | Admitting: Family Medicine

## 2018-09-04 NOTE — Telephone Encounter (Signed)
Reviewed Dr. Felisa BonierPickards note and tx, reviewed Xray, pt can return to work, should avoid prolonged sitting, heavy lifting - note for pt signed.  Thank you Andrey CampanileSandy

## 2018-09-04 NOTE — Telephone Encounter (Signed)
Pt called and states that he was seen here on Monday and was given medication for his back and he had x-ray done yesterday and was out of work Monday through today and would like to go back to work tomorrow as he is feeling much better and only taking the prednisone. He needs a note this afternoon to go back to work tomorrow. OK to write?

## 2018-09-04 NOTE — Telephone Encounter (Signed)
Note left up front and pt is aware to avoid heavy lifting and prolonged sitting. He states that he has a helper so if something heavy needs to be done the helper can do that.

## 2018-11-26 ENCOUNTER — Other Ambulatory Visit: Payer: Self-pay | Admitting: Family Medicine

## 2018-11-26 MED ORDER — CEFDINIR 300 MG PO CAPS: 300.0000 mg | ORAL_CAPSULE | Freq: Two times a day (BID) | ORAL | 0 refills | Status: DC

## 2019-08-07 ENCOUNTER — Encounter: Payer: Self-pay | Admitting: Family Medicine

## 2019-08-07 ENCOUNTER — Ambulatory Visit (INDEPENDENT_AMBULATORY_CARE_PROVIDER_SITE_OTHER): Payer: 59 | Admitting: Family Medicine

## 2019-08-07 ENCOUNTER — Other Ambulatory Visit: Payer: Self-pay

## 2019-08-07 VITALS — BP 136/74 | HR 82 | Temp 97.8°F | Resp 16 | Ht 66.0 in | Wt 181.0 lb

## 2019-08-07 DIAGNOSIS — Z13220 Encounter for screening for lipoid disorders: Secondary | ICD-10-CM

## 2019-08-07 DIAGNOSIS — M25532 Pain in left wrist: Secondary | ICD-10-CM

## 2019-08-07 MED ORDER — ALPRAZOLAM 0.5 MG PO TABS: 0.5000 mg | ORAL_TABLET | Freq: Three times a day (TID) | ORAL | 0 refills | Status: DC | PRN
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
## 2019-08-08 ENCOUNTER — Other Ambulatory Visit: Payer: Self-pay | Admitting: Family Medicine

## 2019-08-08 ENCOUNTER — Ambulatory Visit
Admission: RE | Admit: 2019-08-08 | Discharge: 2019-08-08 | Disposition: A | Discharge status: Home / Self Care | Payer: 59 | Source: Ambulatory Visit | Attending: Family Medicine | Admitting: Family Medicine

## 2019-08-08 ENCOUNTER — Other Ambulatory Visit: Payer: 59

## 2019-08-08 DIAGNOSIS — M25532 Pain in left wrist: Secondary | ICD-10-CM

## 2019-08-08 MED ORDER — DICLOFENAC SODIUM 75 MG PO TBEC: 75.0000 mg | DELAYED_RELEASE_TABLET | Freq: Two times a day (BID) | ORAL | 2 refills | Status: DC

## 2019-08-09 LAB — COMPLETE METABOLIC PANEL WITH GFR
AG Ratio: 1.8 (calc) (ref 1.0–2.5)
ALT: 30 U/L (ref 9–46)
AST: 21 U/L (ref 10–40)
Albumin: 4.1 g/dL (ref 3.6–5.1)
Alkaline phosphatase (APISO): 94 U/L (ref 36–130)
BUN: 19 mg/dL (ref 7–25)
CO2: 25 mmol/L (ref 20–32)
Calcium: 9.5 mg/dL (ref 8.6–10.3)
Chloride: 103 mmol/L (ref 98–110)
Creat: 0.94 mg/dL (ref 0.60–1.35)
GFR, Est African American: 115 mL/min/{1.73_m2} (ref >=60)
GFR, Est Non African American: 99 mL/min/{1.73_m2} (ref >=60)
Globulin: 2.3 g/dL (calc) (ref 1.9–3.7)
Glucose, Bld: 89 mg/dL (ref 65–99)
Potassium: 4.4 mmol/L (ref 3.5–5.3)
Sodium: 137 mmol/L (ref 135–146)
Total Bilirubin: 0.7 mg/dL (ref 0.2–1.2)
Total Protein: 6.4 g/dL (ref 6.1–8.1)

## 2019-08-09 LAB — LIPID PANEL
Cholesterol: 221 mg/dL — ABNORMAL HIGH (ref <200)
HDL: 41 mg/dL (ref >=40)
Non-HDL Cholesterol (Calc): 180 mg/dL (calc) — ABNORMAL HIGH (ref <130)
Total CHOL/HDL Ratio: 5.4 (calc) — ABNORMAL HIGH (ref <5.0)
Triglycerides: 501 mg/dL — ABNORMAL HIGH (ref <150)

## 2019-08-09 LAB — CBC WITH DIFFERENTIAL/PLATELET
Absolute Monocytes: 770 cells/uL (ref 200–950)
Basophils Absolute: 43 cells/uL (ref 0–200)
Basophils Relative: 0.6 %
Eosinophils Absolute: 137 cells/uL (ref 15–500)
Eosinophils Relative: 1.9 %
HCT: 48.2 % (ref 38.5–50.0)
Hemoglobin: 16.6 g/dL (ref 13.2–17.1)
Lymphs Abs: 2506 cells/uL (ref 850–3900)
MCH: 31.8 pg (ref 27.0–33.0)
MCHC: 34.4 g/dL (ref 32.0–36.0)
MCV: 92.3 fL (ref 80.0–100.0)
MPV: 10.1 fL (ref 7.5–12.5)
Monocytes Relative: 10.7 %
Neutro Abs: 3744 cells/uL (ref 1500–7800)
Neutrophils Relative %: 52 %
Platelets: 347 10*3/uL (ref 140–400)
RBC: 5.22 10*6/uL (ref 4.20–5.80)
RDW: 12.3 % (ref 11.0–15.0)
Total Lymphocyte: 34.8 %
WBC: 7.2 10*3/uL (ref 3.8–10.8)

## 2019-08-11 ENCOUNTER — Other Ambulatory Visit: Payer: Self-pay | Admitting: Family Medicine

## 2019-08-11 MED ORDER — FENOFIBRATE 160 MG PO TABS: 160.0000 mg | ORAL_TABLET | Freq: Every day | ORAL | 3 refills | Status: DC

## 2019-09-11 ENCOUNTER — Other Ambulatory Visit: Payer: Self-pay

## 2019-09-11 ENCOUNTER — Ambulatory Visit (INDEPENDENT_AMBULATORY_CARE_PROVIDER_SITE_OTHER): Payer: BC Managed Care – PPO | Admitting: Family Medicine

## 2019-09-11 ENCOUNTER — Encounter: Payer: Self-pay | Admitting: Family Medicine

## 2019-09-11 VITALS — BP 150/90 | HR 72 | Temp 97.4°F | Resp 16 | Ht 66.0 in | Wt 184.0 lb

## 2019-09-11 DIAGNOSIS — G5603 Carpal tunnel syndrome, bilateral upper limbs: Secondary | ICD-10-CM

## 2019-09-11 DIAGNOSIS — M25532 Pain in left wrist: Secondary | ICD-10-CM | POA: Diagnosis not present

## 2019-09-11 MED ORDER — MELOXICAM 15 MG PO TABS: 15.0000 mg | ORAL_TABLET | Freq: Every day | ORAL | 0 refills | Status: DC

## 2019-09-11 MED ORDER — ALPRAZOLAM 0.5 MG PO TABS: 0.5000 mg | ORAL_TABLET | Freq: Three times a day (TID) | ORAL | 0 refills | Status: DC | PRN

## 2019-09-11 MED ORDER — FENOFIBRATE 160 MG PO TABS: 160.0000 mg | ORAL_TABLET | Freq: Every day | ORAL | 3 refills | Status: DC
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
## 2019-09-29 ENCOUNTER — Ambulatory Visit (INDEPENDENT_AMBULATORY_CARE_PROVIDER_SITE_OTHER): Payer: BC Managed Care – PPO | Admitting: Family Medicine

## 2019-09-29 ENCOUNTER — Encounter: Payer: Self-pay | Admitting: Family Medicine

## 2019-09-29 ENCOUNTER — Other Ambulatory Visit: Payer: Self-pay

## 2019-09-29 VITALS — BP 120/70 | HR 75 | Temp 98.2°F | Resp 15 | Ht 66.0 in | Wt 186.0 lb

## 2019-09-29 DIAGNOSIS — M7552 Bursitis of left shoulder: Secondary | ICD-10-CM

## 2019-09-29 NOTE — Progress Notes (Signed)
Subjective:    Patient ID: Alex Hurst, male    DOB: 11/19/1975, 43 y.o.   MRN: [id number]  HPI Patient has a history of subacromial bursitis in his left shoulder.  He has previously had 2 cortisone injections in that shoulder.  He states that over the last several weeks, he has been having increasing pain in the left shoulder particularly with abduction greater than 100 degrees.  He states that the pain has been present now for almost 2 months.  He has not had a cortisone injection in that shoulder in more than 2 years.  He denies any recent falls or injuries although he has been doing more overhead activity.  He has pain with abduction greater than 100 degrees.  He has positive empty can sign although he does have pain with resisted abduction.  He has pain with internal rotation.  He has a positive Hawking sign.  He has a negative Spurling sign. No past medical history on file. No past surgical history on file. Current Outpatient Medications on File Prior to Visit  Medication Sig Dispense Refill  . ALPRAZolam (XANAX) 0.5 MG tablet Take 1 tablet (0.5 mg total) by mouth 3 (three) times daily as needed. 30 tablet 0  . fenofibrate 160 MG tablet Take 1 tablet (160 mg total) by mouth daily. 30 tablet 3  . fluticasone (FLONASE) 50 MCG/ACT nasal spray Place 2 sprays into both nostrils daily.    . meloxicam (MOBIC) 15 MG tablet Take 1 tablet (15 mg total) by mouth daily. 30 tablet 0  . diclofenac (VOLTAREN) 75 MG EC tablet Take 1 tablet (75 mg total) by mouth 2 (two) times daily. (Patient not taking: Reported on 09/29/2019) 60 tablet 2   No current facility-administered medications on file prior to visit.   No Known Allergies Social History   Socioeconomic History  . Marital status: Married    Spouse name: Not on file  . Number of children: Not on file  . Years of education: Not on file  . Highest education level: Not on file  Occupational History  . Not on file  Tobacco Use  . Smoking  status: Current Every Day Smoker  . Smokeless tobacco: Never Used  Substance and Sexual Activity  . Alcohol use: Yes  . Drug use: No  . Sexual activity: Not on file  Other Topics Concern  . Not on file  Social History Narrative  . Not on file   Social Determinants of Health   Financial Resource Strain:   . Difficulty of Paying Living Expenses: Not on file  Food Insecurity:   . Worried About Programme researcher, broadcasting/film/video in the Last Year: Not on file  . Ran Out of Food in the Last Year: Not on file  Transportation Needs:   . Lack of Transportation (Medical): Not on file  . Lack of Transportation (Non-Medical): Not on file  Physical Activity:   . Days of Exercise per Week: Not on file  . Minutes of Exercise per Session: Not on file  Stress:   . Feeling of Stress : Not on file  Social Connections:   . Frequency of Communication with Friends and Family: Not on file  . Frequency of Social Gatherings with Friends and Family: Not on file  . Attends Religious Services: Not on file  . Active Member of Clubs or Organizations: Not on file  . Attends Banker Meetings: Not on file  . Marital Status: Not on file  Intimate  Partner Violence:   . Fear of Current or Ex-Partner: Not on file  . Emotionally Abused: Not on file  . Physically Abused: Not on file  . Sexually Abused: Not on file      Review of Systems  All other systems reviewed and are negative.      Objective:   Physical Exam Vitals reviewed.  Constitutional:      Appearance: Normal appearance.  Cardiovascular:     Rate and Rhythm: Normal rate and regular rhythm.     Heart sounds: Normal heart sounds.  Musculoskeletal:     Left shoulder: Tenderness present. No crepitus. Decreased range of motion. Normal strength.  Neurological:     Mental Status: He is alert.           Assessment & Plan:  Subacromial bursitis of left shoulder joint  Patient has subacromial bursitis in the left shoulder.  Discussed the  risk and benefits and elects to proceed with a cortisone injection.  Using sterile technique, 2 cc lidocaine, 2 cc of Marcaine, and 2 cc of 40 mg/mL Kenalog were injected in the left subacromial space.  The patient tolerated the procedure well without complication.

## 2019-10-11 ENCOUNTER — Other Ambulatory Visit: Payer: Self-pay | Admitting: Family Medicine

## 2019-10-13 MED ORDER — ALPRAZOLAM 0.5 MG PO TABS: 0.5000 mg | ORAL_TABLET | Freq: Three times a day (TID) | ORAL | 0 refills | Status: DC | PRN

## 2019-10-13 MED ORDER — MELOXICAM 15 MG PO TABS: 15.0000 mg | ORAL_TABLET | Freq: Every day | ORAL | 0 refills | Status: DC

## 2019-10-13 NOTE — Telephone Encounter (Signed)
Ok to refill??  Last office visit 09/29/2019.  Last refill 09/11/2019.

## 2019-10-17 ENCOUNTER — Other Ambulatory Visit: Payer: Self-pay | Admitting: Family Medicine

## 2019-11-13 ENCOUNTER — Other Ambulatory Visit: Payer: Self-pay | Admitting: Family Medicine

## 2019-12-22 ENCOUNTER — Telehealth: Payer: Self-pay | Admitting: Family Medicine

## 2019-12-22 MED ORDER — FENOFIBRATE 145 MG PO TABS: 145.0000 mg | ORAL_TABLET | Freq: Every day | ORAL | 3 refills | Status: DC

## 2019-12-22 NOTE — Telephone Encounter (Signed)
Please switch to 145 mg poqday

## 2019-12-22 NOTE — Telephone Encounter (Signed)
Re: Fenofibrate 160mg  - pharmacy sent note stating that pt's ins will cover 134mg  or 145mg . Would you like to change?

## 2019-12-22 NOTE — Telephone Encounter (Signed)
Rx sent to pharm

## 2020-01-20 ENCOUNTER — Other Ambulatory Visit: Payer: Self-pay | Admitting: Family Medicine

## 2020-02-20 ENCOUNTER — Other Ambulatory Visit: Payer: Self-pay | Admitting: Family Medicine

## 2020-03-25 IMAGING — DX DG WRIST COMPLETE 3+V*L*
4 series · 4 of 4 positions shown · non-contrast
Comparison: None

CLINICAL DATA: Pain at first CMC joint and base of thumb since
06/20/2019, possible injury

EXAM:
LEFT WRIST - COMPLETE 3+ VIEW

[dg wrist complete left (1 of 4)]
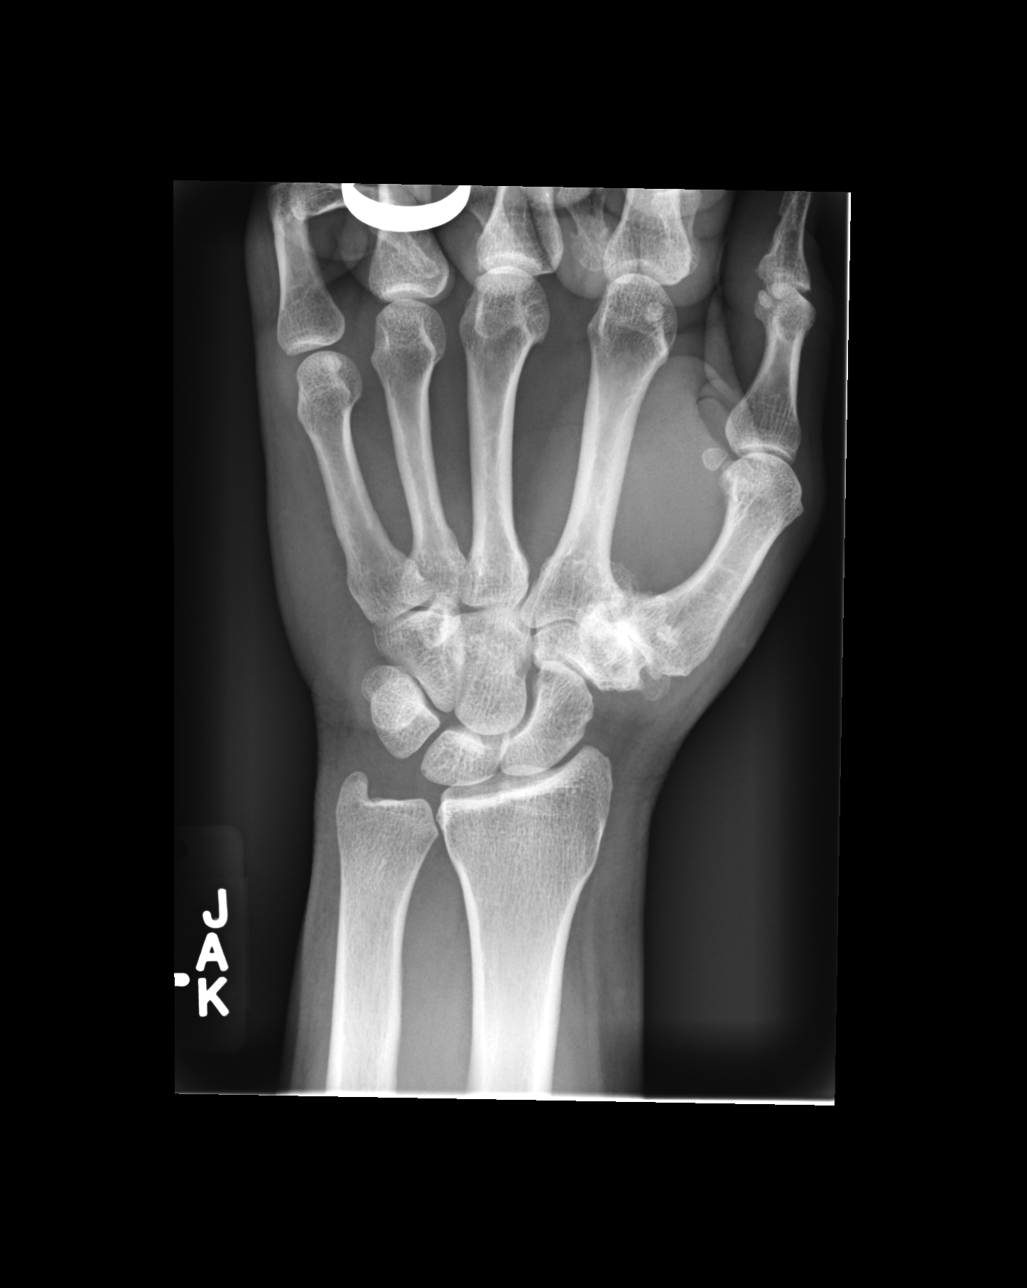

[dg wrist complete left (2 of 4)]
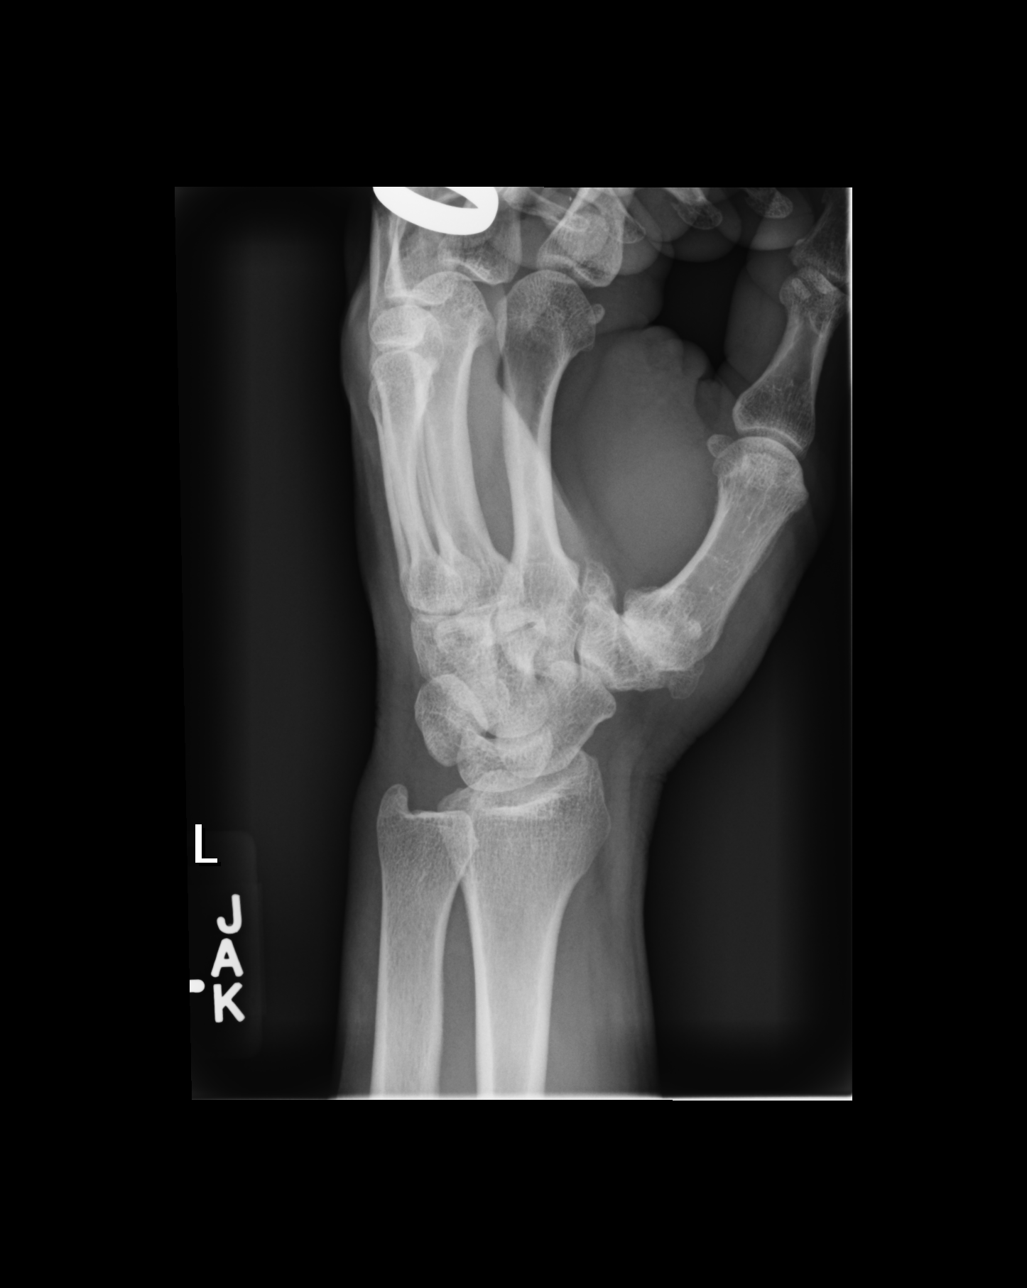

[dg wrist complete left (3 of 4)]
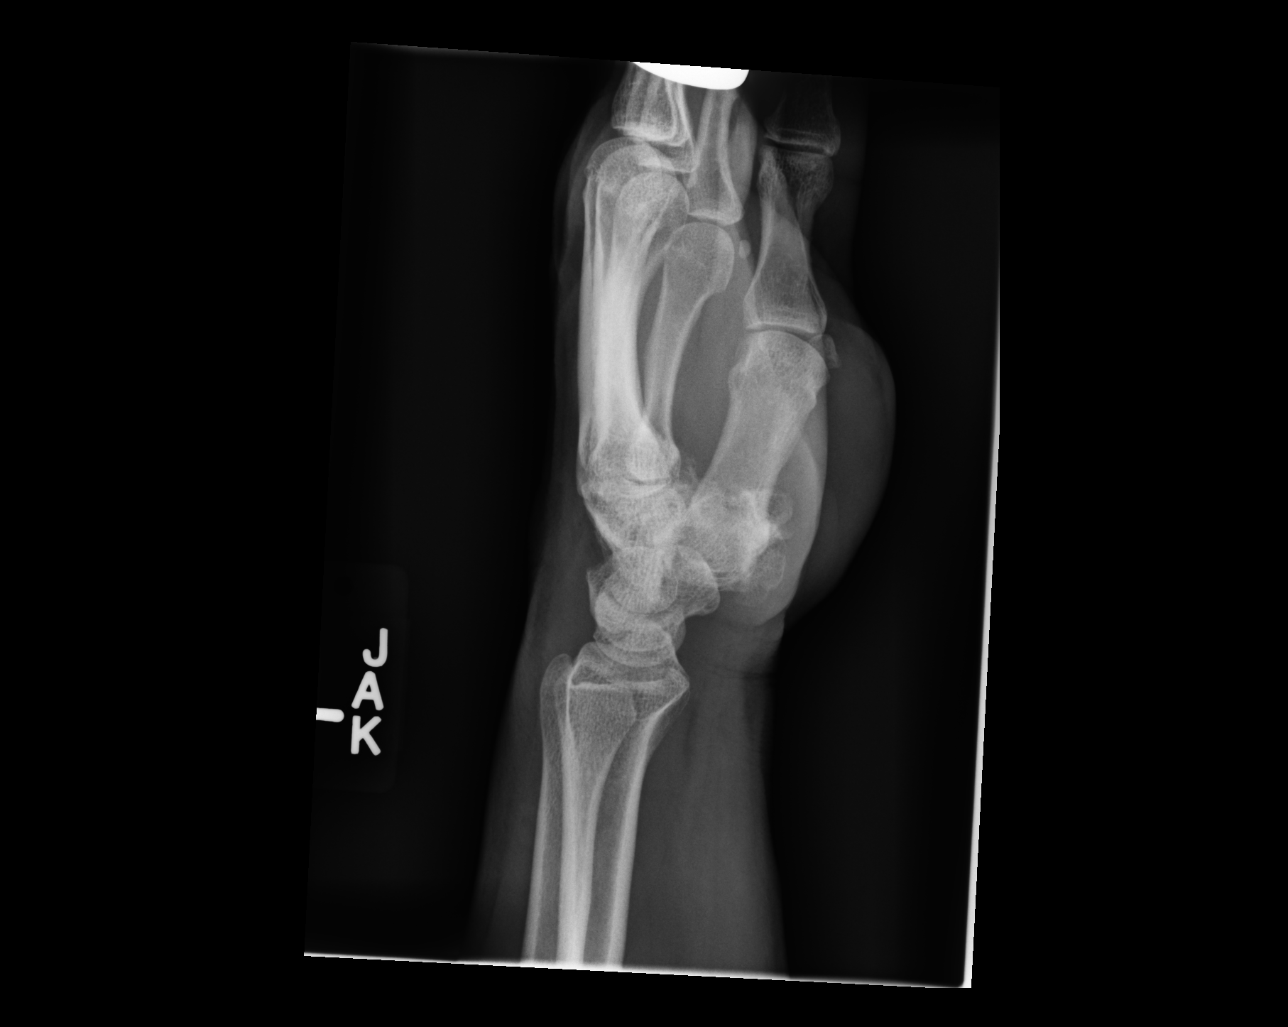

[dg wrist complete left (4 of 4)]
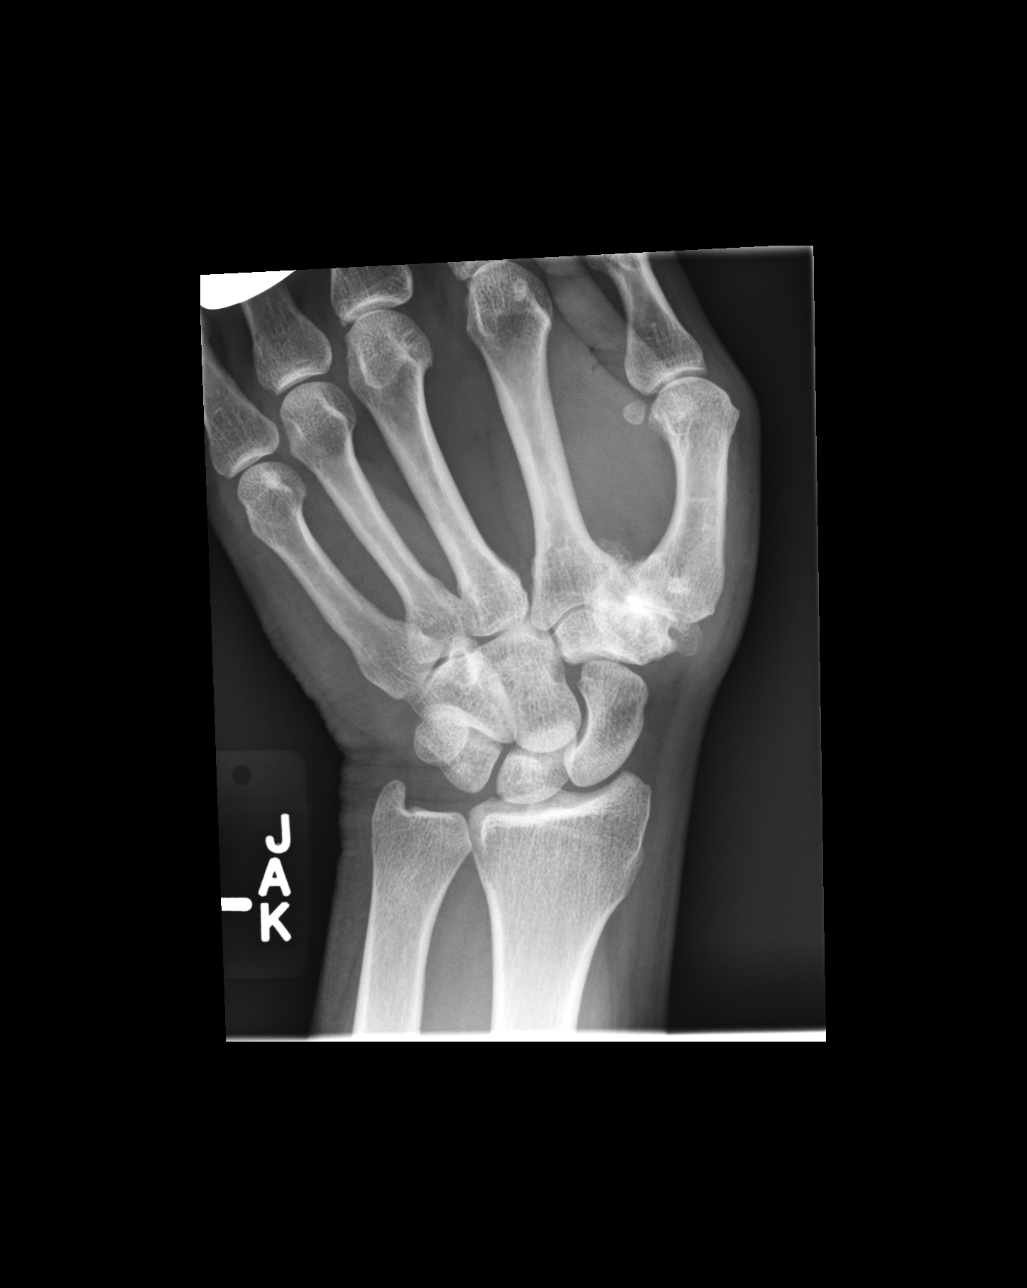

[4 of 4 positions shown; findings below may reference images not displayed]

FINDINGS: Osseous mineralization normal.

Advanced degenerative changes of first CMC joint with joint space
narrowing, sclerosis, and significant spur formation.

Remaining joint spaces preserved.

No acute fracture, dislocation, or bone destruction.
IMPRESSION: Advanced osteoarthritic changes at the first CMC joint.

No acute abnormalities.

## 2020-08-03 ENCOUNTER — Other Ambulatory Visit: Payer: Self-pay | Admitting: Family Medicine

## 2020-08-03 NOTE — Telephone Encounter (Signed)
Please Advise

## 2020-12-17 ENCOUNTER — Other Ambulatory Visit: Payer: Self-pay | Admitting: Family Medicine

## 2021-01-23 ENCOUNTER — Other Ambulatory Visit: Payer: Self-pay | Admitting: Family Medicine

## 2021-02-17 ENCOUNTER — Other Ambulatory Visit: Payer: Self-pay | Admitting: Family Medicine

## 2021-04-19 ENCOUNTER — Other Ambulatory Visit: Payer: Self-pay | Admitting: Family Medicine

## 2021-05-13 ENCOUNTER — Other Ambulatory Visit: Payer: Self-pay | Admitting: Family Medicine

## 2022-01-11 ENCOUNTER — Emergency Department (HOSPITAL_COMMUNITY): Payer: BC Managed Care – PPO

## 2022-01-11 ENCOUNTER — Emergency Department (HOSPITAL_COMMUNITY)
Admission: EM | Admit: 2022-01-11 | Discharge: 2022-01-12 | Disposition: A | Discharge status: Home / Self Care | Payer: BC Managed Care – PPO | Attending: Emergency Medicine | Admitting: Emergency Medicine

## 2022-01-11 DIAGNOSIS — N200 Calculus of kidney: Secondary | ICD-10-CM

## 2022-01-11 DIAGNOSIS — R1032 Left lower quadrant pain: Secondary | ICD-10-CM | POA: Insufficient documentation

## 2022-01-11 DIAGNOSIS — R109 Unspecified abdominal pain: Secondary | ICD-10-CM | POA: Diagnosis present

## 2022-01-11 LAB — URINALYSIS, ROUTINE W REFLEX MICROSCOPIC
Bilirubin Urine: NEGATIVE
Glucose, UA: NEGATIVE mg/dL
Ketones, ur: 20 mg/dL — AB
Leukocytes,Ua: NEGATIVE
Nitrite: NEGATIVE
Protein, ur: NEGATIVE mg/dL
Specific Gravity, Urine: 1.024 (ref 1.005–1.030)
pH: 5 (ref 5.0–8.0)

## 2022-01-11 LAB — CBC
HCT: 49.1 % (ref 39.0–52.0)
Hemoglobin: 16.6 g/dL (ref 13.0–17.0)
MCH: 31.5 pg (ref 26.0–34.0)
MCHC: 33.8 g/dL (ref 30.0–36.0)
MCV: 93.2 fL (ref 80.0–100.0)
Platelets: 390 10*3/uL (ref 150–400)
RBC: 5.27 MIL/uL (ref 4.22–5.81)
RDW: 12.5 % (ref 11.5–15.5)
WBC: 21.7 10*3/uL — ABNORMAL HIGH (ref 4.0–10.5)
nRBC: 0 % (ref 0.0–0.2)

## 2022-01-11 LAB — COMPREHENSIVE METABOLIC PANEL
ALT: 40 U/L (ref 0–44)
AST: 27 U/L (ref 15–41)
Albumin: 4.5 g/dL (ref 3.5–5.0)
Alkaline Phosphatase: 95 U/L (ref 38–126)
Anion gap: 12 (ref 5–15)
BUN: 14 mg/dL (ref 6–20)
CO2: 23 mmol/L (ref 22–32)
Calcium: 9.5 mg/dL (ref 8.9–10.3)
Chloride: 102 mmol/L (ref 98–111)
Creatinine, Ser: 1.21 mg/dL (ref 0.61–1.24)
GFR, Estimated: >60 mL/min (ref >60)
Glucose, Bld: 151 mg/dL — ABNORMAL HIGH (ref 70–99)
Potassium: 4 mmol/L (ref 3.5–5.1)
Sodium: 137 mmol/L (ref 135–145)
Total Bilirubin: 0.8 mg/dL (ref 0.3–1.2)
Total Protein: 7.1 g/dL (ref 6.5–8.1)

## 2022-01-11 LAB — LIPASE, BLOOD: Lipase: 33 U/L (ref 11–51)

## 2022-01-11 MED ORDER — ONDANSETRON 4 MG PO TBDP
4.0000 mg | ORAL_TABLET | Freq: Once | ORAL | Status: AC | PRN
  Administered 2022-01-11: 4 mg via ORAL
  Filled 2022-01-11: qty 1

## 2022-01-11 MED ORDER — IOHEXOL 300 MG/ML  SOLN
100.0000 mL | Freq: Once | INTRAMUSCULAR | Status: AC | PRN
  Administered 2022-01-11: 100 mL via INTRAVENOUS

## 2022-01-11 NOTE — ED Triage Notes (Incomplete)
Pt c/o LLQ abd pain since 3p, progressively worsening. Associated nausea & back pain, testicle pain. Last food/drink approx 3p, all abd organs intact ?

## 2022-01-11 NOTE — ED Triage Notes (Signed)
Pain in the abdomen that started 4 pm, hurting badly, was curled up in a ball. Now pain has subsided some ?

## 2022-01-12 MED ORDER — OXYCODONE-ACETAMINOPHEN 5-325 MG PO TABS
1.0000 | ORAL_TABLET | Freq: Four times a day (QID) | ORAL | 0 refills | Status: DC | PRN
Start: 2022-01-12 — End: 2023-11-08

## 2022-01-12 NOTE — ED Notes (Addendum)
Pt discharged my Mikayla S RN No questions regarding discharge papers- Pt ambulatory out of ED  ?

## 2022-01-12 NOTE — Discharge Instructions (Signed)
Begin taking Percocet as prescribed as needed for pain. ? ?Follow-up with urology if symptoms are not improving in the next 3 to 4 days.  The contact information for [facility] has been provided in this discharge summary for you to call and make these arrangements. ?

## 2022-01-12 NOTE — ED Provider Notes (Signed)
?[facility] ?Provider Note ? ? ?CSN: [id number] ?Arrival date & time: 01/11/22  1745 ? ?  ? ?History ? ?Chief Complaint  ?Patient presents with  ? Abdominal Pain  ? Emesis  ? ? ?Alex Hurst is a 46 y.o. male. ? ?Patient is a 46 year old male with history of hyperlipidemia.  Patient presenting with complaints of left flank..  This started acutely earlier this afternoon.  Pain radiated into his groin.  He denies any fevers or chills.  He denies any bowel or bladder complaints.  He reports a brother with history of kidney stones, but has no personal history of kidney stones. ? ?The history is provided by the patient.  ?Abdominal Pain ?Pain location:  L flank ?Pain quality: stabbing   ?Pain radiates to:  LLQ ?Pain severity:  Severe ?Onset quality:  Sudden ?Timing:  Constant ?Progression:  Partially resolved ?Chronicity:  New ?Relieved by:  Nothing ?Worsened by:  Nothing ?Ineffective treatments:  None tried ?Associated symptoms: vomiting   ?Emesis ?Associated symptoms: abdominal pain   ? ?  ? ?Home Medications ?Prior to Admission medications   ?Medication Sig Start Date End Date Taking? Authorizing Provider  ?ALPRAZolam (XANAX) 0.5 MG tablet Take 1 tablet (0.5 mg total) by mouth 3 (three) times daily as needed. 10/13/19   Donita BrooksPickard, Warren T, MD  ?diclofenac (VOLTAREN) 75 MG EC tablet Take 1 tablet (75 mg total) by mouth 2 (two) times daily. ?Patient not taking: Reported on 09/29/2019 08/08/19   Donita BrooksPickard, Warren T, MD  ?fenofibrate (TRICOR) 145 MG tablet TAKE 1 TABLET BY MOUTH EVERY DAY 04/19/21   Donita BrooksPickard, Warren T, MD  ?fluticasone (FLONASE) 50 MCG/ACT nasal spray Place 2 sprays into both nostrils daily.    [provider]  ?meloxicam (MOBIC) 15 MG tablet TAKE 1 TABLET(15 MG) BY MOUTH DAILY 12/20/20   Donita BrooksPickard, Warren T, MD  ?   ? ?Allergies    ?Patient has no known allergies.   ? ?Review of Systems   ?Review of Systems  ?Gastrointestinal:  Positive for abdominal pain and  vomiting.  ?All other systems reviewed and are negative. ? ?Physical Exam ?Updated Vital Signs ?BP 128/85 (BP Location: Right Arm)   Pulse 69   Resp 18   SpO2 95%  ?Physical Exam ?Vitals and nursing note reviewed.  ?Constitutional:   ?   General: He is not in acute distress. ?   Appearance: He is well-developed. He is not diaphoretic.  ?HENT:  ?   Head: Normocephalic and atraumatic.  ?Cardiovascular:  ?   Rate and Rhythm: Normal rate and regular rhythm.  ?   Heart sounds: No murmur heard. ?  No friction rub.  ?Pulmonary:  ?   Effort: Pulmonary effort is normal. No respiratory distress.  ?   Breath sounds: Normal breath sounds. No wheezing or rales.  ?Abdominal:  ?   General: Bowel sounds are normal. There is no distension.  ?   Palpations: Abdomen is soft.  ?   Tenderness: There is abdominal tenderness in the left lower quadrant. There is left CVA tenderness. There is no guarding or rebound.  ?Musculoskeletal:     ?   General: Normal range of motion.  ?   Cervical back: Normal range of motion and neck supple.  ?Skin: ?   General: Skin is warm and dry.  ?Neurological:  ?   Mental Status: He is alert and oriented to person, place, and time.  ?   Coordination: Coordination normal.  ? ? ?  ED Results / Procedures / Treatments   ?Labs ?(all labs ordered are listed, but only abnormal results are displayed) ?Labs Reviewed  ?COMPREHENSIVE METABOLIC PANEL - Abnormal; Notable for the following components:  ?    Result Value  ? Glucose, Bld 151 (*)   ? All other components within normal limits  ?CBC - Abnormal; Notable for the following components:  ? WBC 21.7 (*)   ? All other components within normal limits  ?URINALYSIS, ROUTINE W REFLEX MICROSCOPIC - Abnormal; Notable for the following components:  ? Hgb urine dipstick MODERATE (*)   ? Ketones, ur 20 (*)   ? Bacteria, UA RARE (*)   ? All other components within normal limits  ?LIPASE, BLOOD  ? ? ?EKG ?None ? ?Radiology ?CT ABDOMEN PELVIS W CONTRAST ? ?Result Date:  01/11/2022 ?CLINICAL DATA:  LLQ abdominal pain EXAM: CT ABDOMEN AND PELVIS WITH CONTRAST TECHNIQUE: Multidetector CT imaging of the abdomen and pelvis was performed using the standard protocol following bolus administration of intravenous contrast. RADIATION DOSE REDUCTION: This exam was performed according to the departmental dose-optimization program which includes automated exposure control, adjustment of the mA and/or kV according to patient size and/or use of iterative reconstruction technique. CONTRAST:  OMNIPAQUE IOHEXOL 300 MG/ML  SOLN COMPARISON:  None. FINDINGS: Lower chest: No acute abnormality. Hepatobiliary: The hepatic parenchyma is diffusely hypodense compared to the splenic parenchyma consistent with fatty infiltration. Subcentimeter hypodensity too small to characterize. No gallstones, gallbladder wall thickening, or pericholecystic fluid. No biliary dilatation. Pancreas: No focal lesion. Normal pancreatic contour. No surrounding inflammatory changes. No main pancreatic ductal dilatation. Spleen: Normal in size without focal abnormality. Adrenals/Urinary Tract: No adrenal nodule bilaterally. Bilateral kidneys enhance symmetrically. 2 mm calcified stone at the left ureterovesicular junction with associated mild hydroureteronephrosis. Urothelial thickening is noted. Nonspecific asymmetric left perinephric stranding compared to the right. No left nephrolithiasis. No right nephroureterolithiasis. No right hydroureteronephrosis. The urinary bladder is unremarkable. Stomach/Bowel: Stomach is within normal limits. No evidence of bowel wall thickening or dilatation. Appendix appears normal. Vascular/Lymphatic: No abdominal aorta or iliac aneurysm. No abdominal, pelvic, or inguinal lymphadenopathy. Reproductive: Prostate is unremarkable. Other: No intraperitoneal free fluid. No intraperitoneal free gas. No organized fluid collection. Musculoskeletal: No abdominal wall hernia or abnormality. No  suspicious lytic or blastic osseous lesions. No acute displaced fracture. Multilevel degenerative changes of the spine. IMPRESSION: 1. Obstructive 2 mm left ureterovesicular junction stone. Associated urothelial thickening concerning for superimposed infection. Correlate with urinalysis. 2. Hepatic steatosis. Electronically Signed   By: Tish Frederickson M.D.   On: 01/11/2022 23:21   ? ?Procedures ?Procedures  ? ? ?Medications Ordered in ED ?Medications  ?ondansetron (ZOFRAN-ODT) disintegrating tablet 4 mg (4 mg Oral Given 01/11/22 1827)  ?iohexol (OMNIPAQUE) 300 MG/ML solution 100 mL (100 mLs Intravenous Contrast Given 01/11/22 2254)  ? ? ?ED Course/ Medical Decision Making/ A&P ? ?Patient presenting here with complaints of left flank pain.  This started acutely this afternoon in the absence of any injury or trauma.  Pain radiated into his left groin and testicle.  He reports nausea but no vomiting. ? ?Patient arrives with stable vital signs.  Work-up initiated in triage including laboratory studies, urinalysis, and CT renal.  CT scan shows a 2 mm stone at the left UVJ with a associated mild hydroureteronephrosis.  There is the mention of urothelial thickening concerning for superimposed infection, however his urinalysis shows no evidence for infection.  Patient does have a white count of 21,000 which I believe is  a stress response related to pain.  Patient is nontoxic and well-appearing with no fever. ? ?I feel as though patient can safely be discharged with pain medication as needed and follow-up with urology as needed. ? ?Final Clinical Impression(s) / ED Diagnoses ?Final diagnoses:  ?None  ? ? ?Rx / DC Orders ?ED Discharge Orders   ? ? None  ? ?  ? ? ?  ?Geoffery Lyons, MD ?01/12/22 0254 ? ?

## 2022-04-06 ENCOUNTER — Ambulatory Visit
Admission: EM | Admit: 2022-04-06 | Discharge: 2022-04-06 | Disposition: A | Discharge status: Home / Self Care | Payer: BC Managed Care – PPO | Attending: Emergency Medicine | Admitting: Emergency Medicine

## 2022-04-06 ENCOUNTER — Encounter: Payer: Self-pay | Admitting: Emergency Medicine

## 2022-04-06 DIAGNOSIS — J0100 Acute maxillary sinusitis, unspecified: Secondary | ICD-10-CM

## 2022-04-06 DIAGNOSIS — J029 Acute pharyngitis, unspecified: Secondary | ICD-10-CM

## 2022-04-06 DIAGNOSIS — H109 Unspecified conjunctivitis: Secondary | ICD-10-CM

## 2022-04-06 LAB — POCT RAPID STREP A (OFFICE): Rapid Strep A Screen: NEGATIVE

## 2022-04-06 MED ORDER — CEFDINIR 300 MG PO CAPS: 300.0000 mg | ORAL_CAPSULE | Freq: Two times a day (BID) | ORAL | 0 refills | Status: AC

## 2022-04-06 MED ORDER — POLYMYXIN B-TRIMETHOPRIM 10000-0.1 UNIT/ML-% OP SOLN
1.0000 [drp] | Freq: Four times a day (QID) | OPHTHALMIC | 0 refills | Status: AC
Start: 2022-04-06 — End: 2022-04-13

## 2022-04-06 NOTE — ED Provider Notes (Signed)
Alex Hurst    CSN: [id number] Arrival date & time: 04/06/22  9702      History   Chief Complaint Chief Complaint  Patient presents with   Eye Irritiation   Sore Throat    HPI Alex Hurst is a 46 y.o. male.  Patient presents with sore throat x5 days.  He also reports postnasal drip, nasal congestion and cough.  Treatment attempted at home with Sudafed.  Patient also reports bilateral eye itching, redness, crusting in lashes, yellow-green drainage x 4 days.  No eye injury, eye pain, change in vision.  His wife has pinkeye.  Patient started using her Polytrim eyedrops yesterday.  He denies fever, chills, shortness of breath, vomiting, diarrhea, or other symptoms.    The history is provided by the patient and medical records.    History reviewed. No pertinent past medical history.  Patient Active Problem List   Diagnosis Date Noted   Leukocytosis 04/05/2017    History reviewed. No pertinent surgical history.     Home Medications    Prior to Admission medications   Medication Sig Start Date End Date Taking? Authorizing Provider  cefdinir (OMNICEF) 300 MG capsule Take 1 capsule (300 mg total) by mouth 2 (two) times daily for 7 days. 04/06/22 04/13/22 Yes Mickie Bail, NP  fluticasone (FLONASE) 50 MCG/ACT nasal spray Place 2 sprays into both nostrils daily.   Yes [provider]  trimethoprim-polymyxin b (POLYTRIM) ophthalmic solution Place 1 drop into both eyes 4 (four) times daily for 7 days. 04/06/22 04/13/22 Yes Mickie Bail, NP  ALPRAZolam Prudy Feeler) 0.5 MG tablet Take 1 tablet (0.5 mg total) by mouth 3 (three) times daily as needed. 10/13/19   Donita Brooks, MD  diclofenac (VOLTAREN) 75 MG EC tablet Take 1 tablet (75 mg total) by mouth 2 (two) times daily. Patient not taking: Reported on 09/29/2019 08/08/19   Donita Brooks, MD  fenofibrate (TRICOR) 145 MG tablet TAKE 1 TABLET BY MOUTH EVERY DAY 04/19/21   Donita Brooks, MD  meloxicam (MOBIC) 15  MG tablet TAKE 1 TABLET(15 MG) BY MOUTH DAILY 12/20/20   Donita Brooks, MD  oxyCODONE-acetaminophen (PERCOCET) 5-325 MG tablet Take 1-2 tablets by mouth every 6 (six) hours as needed. 01/12/22   Geoffery Lyons, MD    Family History Family History  Problem Relation Age of Onset   Lung cancer Maternal Grandmother    Lung cancer Paternal Grandmother     Social History Social History   Tobacco Use   Smoking status: Every Day   Smokeless tobacco: Never  Vaping Use   Vaping Use: Never used  Substance Use Topics   Alcohol use: Yes   Drug use: No     Allergies   Patient has no known allergies.   Review of Systems Review of Systems  Constitutional:  Negative for chills and fever.  HENT:  Positive for congestion, postnasal drip, sinus pressure and sore throat. Negative for ear pain.   Eyes:  Positive for discharge, redness and itching. Negative for pain and visual disturbance.  Respiratory:  Positive for cough. Negative for shortness of breath.   Cardiovascular:  Negative for chest pain and palpitations.  Gastrointestinal:  Negative for diarrhea and vomiting.  Skin:  Negative for color change and rash.  All other systems reviewed and are negative.    Physical Exam Triage Vital Signs ED Triage Vitals  Enc Vitals Group     BP      Pulse  Resp      Temp      Temp src      SpO2      Weight      Height      Head Circumference      Peak Flow      Pain Score      Pain Loc      Pain Edu?      Excl. in GC?    No data found.  Updated Vital Signs BP 130/89   Pulse 74   Temp 98.2 F (36.8 C)   Resp 18   SpO2 97%   Visual Acuity Right Eye Distance: 20/20 Left Eye Distance: 20/20 Bilateral Distance: 20/20  Right Eye Near:   Left Eye Near:    Bilateral Near:     Physical Exam Vitals and nursing note reviewed.  Constitutional:      General: He is not in acute distress.    Appearance: Normal appearance. He is well-developed. He is not ill-appearing.   HENT:     Right Ear: Tympanic membrane normal.     Left Ear: Tympanic membrane normal.     Nose: Congestion present.     Mouth/Throat:     Mouth: Mucous membranes are moist.     Pharynx: Posterior oropharyngeal erythema present.  Eyes:     General: Lids are normal. Vision grossly intact.     Extraocular Movements: Extraocular movements intact.     Conjunctiva/sclera:     Right eye: Right conjunctiva is injected.     Left eye: Left conjunctiva is injected.     Pupils: Pupils are equal, round, and reactive to light.     Comments: Thick yellow-green mucous in inner canthus of both eyes.  Cardiovascular:     Rate and Rhythm: Normal rate and regular rhythm.     Heart sounds: Normal heart sounds.  Pulmonary:     Effort: Pulmonary effort is normal. No respiratory distress.     Breath sounds: Normal breath sounds.  Musculoskeletal:     Cervical back: Neck supple.  Skin:    General: Skin is warm and dry.  Neurological:     Mental Status: He is alert.  Psychiatric:        Mood and Affect: Mood normal.        Behavior: Behavior normal.      UC Treatments / Results  Labs (all labs ordered are listed, but only abnormal results are displayed) Labs Reviewed  POCT RAPID STREP A (OFFICE)    EKG   Radiology No results found.  Procedures Procedures (including critical care time)  Medications Ordered in UC Medications - No data to display  Initial Impression / Assessment and Plan / UC Course  I have reviewed the triage vital signs and the nursing notes.  Pertinent labs & imaging results that were available during my care of the patient were reviewed by me and considered in my medical decision making (see chart for details).    Sore throat, acute sinusitis, bilateral conjunctivitis.  Rapid strep negative.  Treating sinus infection with cefdinir as patient states this is previously worked well for him in the past.  Treating conjunctivitis with Polytrim eyedrops.  Education  provided on sinus infection, sore throat, conjunctivitis.  Instructed patient to follow-up with his PCP if his symptoms are not improving.  Patient agrees to plan of care.   Final Clinical Impressions(s) / UC Diagnoses   Final diagnoses:  Sore throat  Acute non-recurrent maxillary sinusitis  Conjunctivitis of both eyes, unspecified conjunctivitis type     Discharge Instructions      Use the antibiotic eyedrops as prescribed.  Take the cefdinir as directed.    Follow-up with your primary care provider if your symptoms are not improving.        ED Prescriptions     Medication Sig Dispense Auth. Provider   trimethoprim-polymyxin b (POLYTRIM) ophthalmic solution Place 1 drop into both eyes 4 (four) times daily for 7 days. 10 mL Mickie Bail, NP   cefdinir (OMNICEF) 300 MG capsule Take 1 capsule (300 mg total) by mouth 2 (two) times daily for 7 days. 14 capsule Mickie Bail, NP      PDMP not reviewed this encounter.   Mickie Bail, NP 04/06/22 1040

## 2022-04-06 NOTE — Discharge Instructions (Addendum)
Use the antibiotic eyedrops as prescribed.  Take the cefdinir as directed.    Follow-up with your primary care provider if your symptoms are not improving.

## 2022-04-06 NOTE — ED Triage Notes (Signed)
Pt presents with ST x 5 days hurts to swallow. He also c/o bilateral eye redness and drainage the left eye x 5 days and the right eye yesterday.

## 2022-08-29 IMAGING — CT CT ABD-PELV W/ CM
2 of 5 series · 15 of 46 positions shown, 17 images · IV contrast (APPLIED)
Comparison: None.

CLINICAL DATA: LLQ abdominal pain

EXAM:
CT ABDOMEN AND PELVIS WITH CONTRAST
TECHNIQUE: Multidetector CT imaging of the abdomen and pelvis was performed
using the standard protocol following bolus administration of
intravenous contrast.

[Series 2: abd/ pelvis 5.0 i30f 2 · axial · 0.83mm/px · z∈[-987,-532]mm · 12 of 103 slices shown, 14 images]
[im 6/103  soft-tissue]
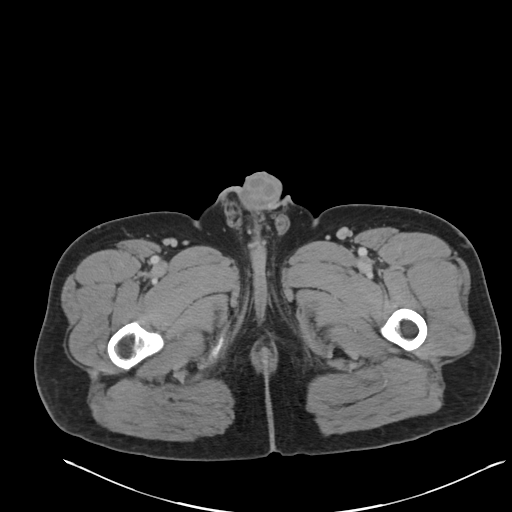
[im 6/103  bone]
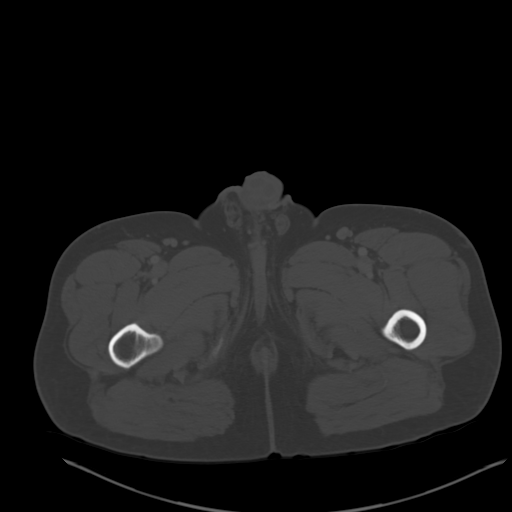
[im 18/103  soft-tissue]
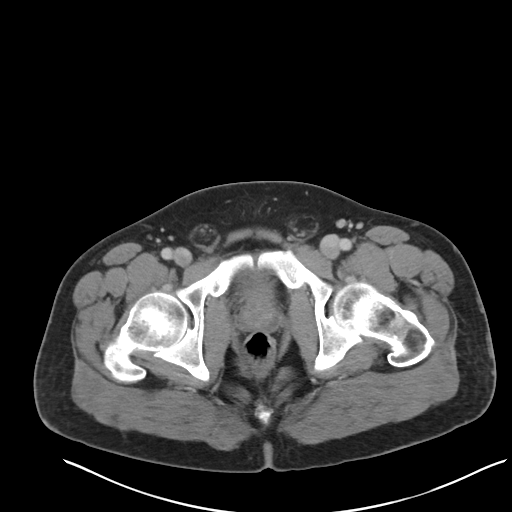
[im 23/103  soft-tissue]
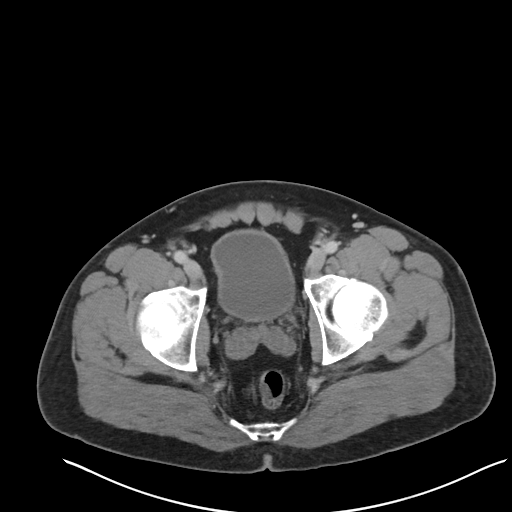
[im 29/103  soft-tissue]
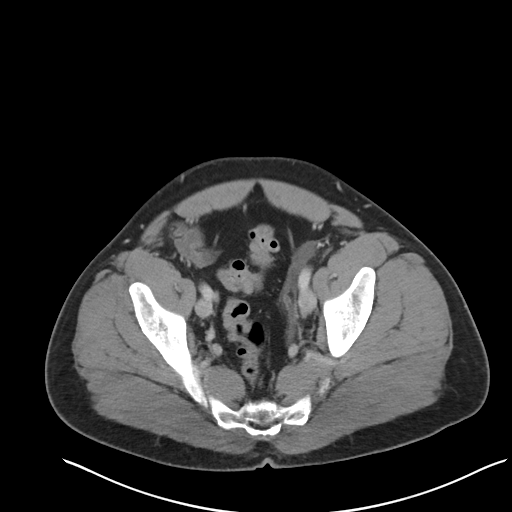
[im 40/103  soft-tissue]
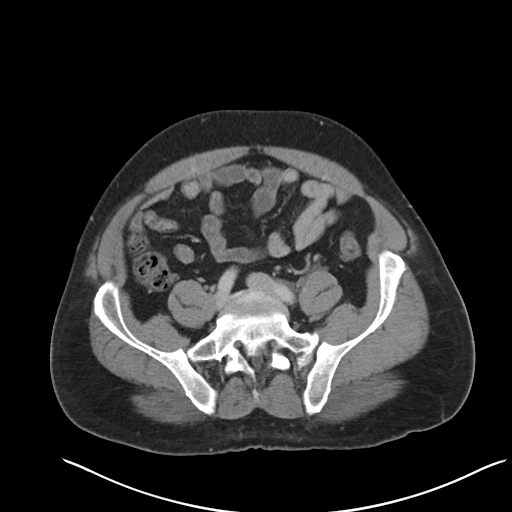
[im 46/103  soft-tissue]
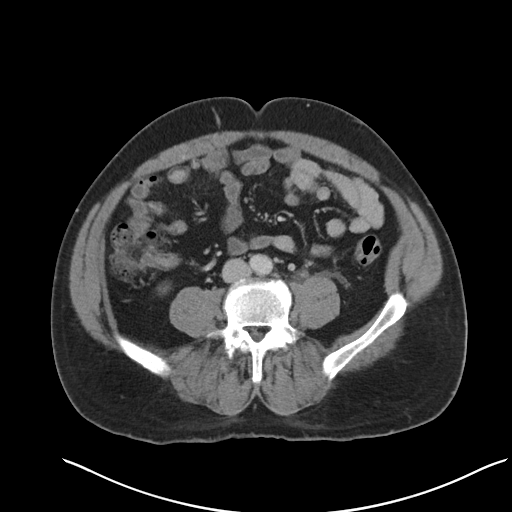
[im 57/103  soft-tissue]
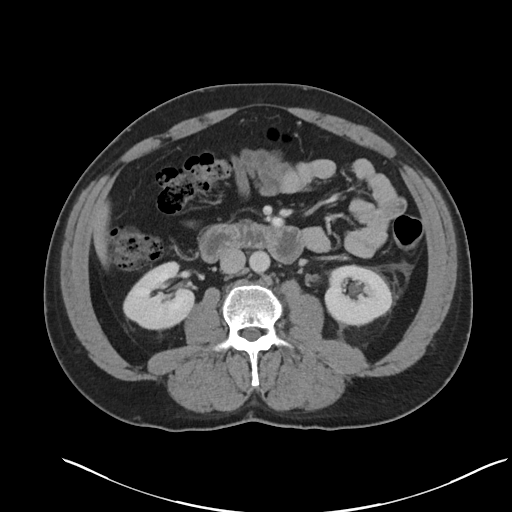
[im 63/103  soft-tissue]
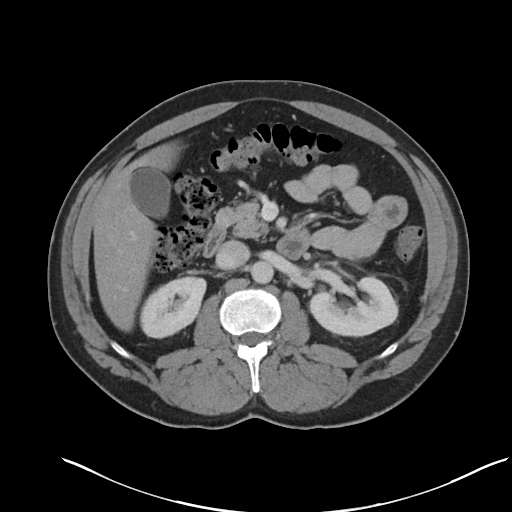
[im 74/103  soft-tissue]
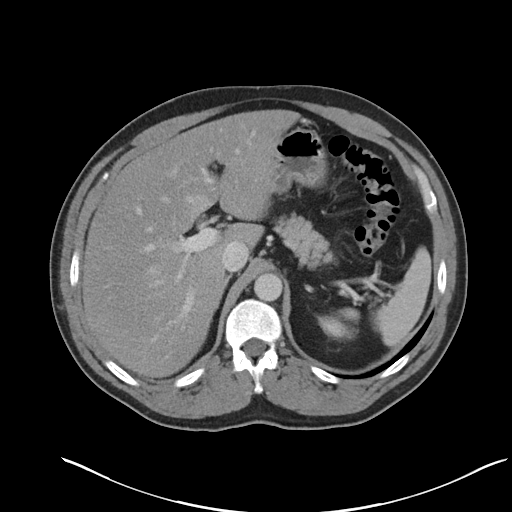
[im 74/103  bone]
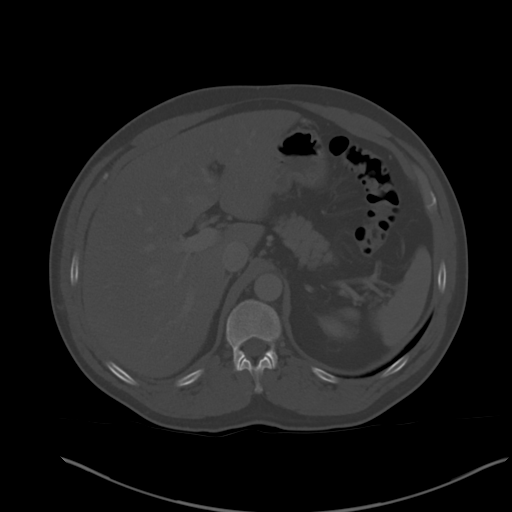
[im 80/103  soft-tissue]
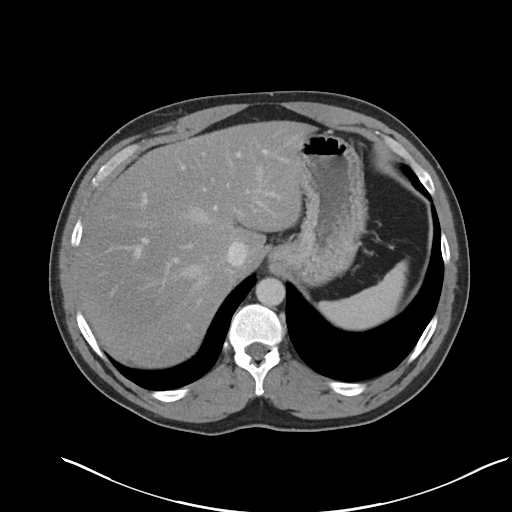
[im 86/103  soft-tissue]
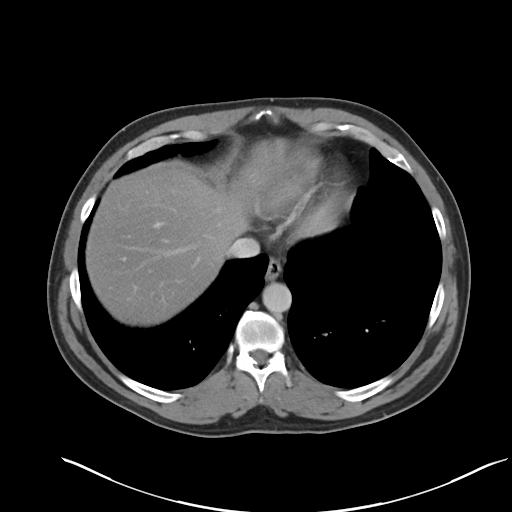
[im 97/103  soft-tissue]
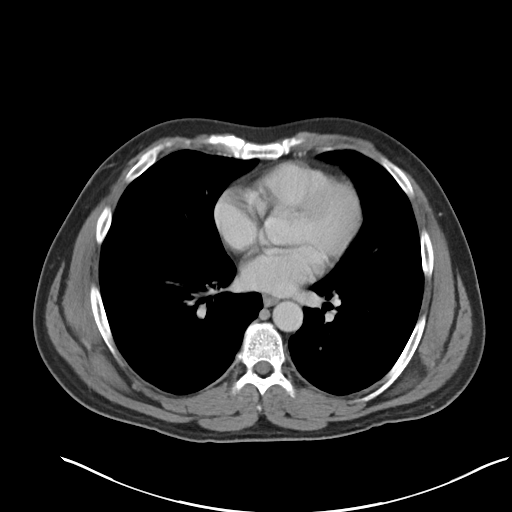

[Series 5: coronal soft tissue · coronal · 0.84mm/px · 3 of 123 slices shown]
[im 41/123  soft-tissue]
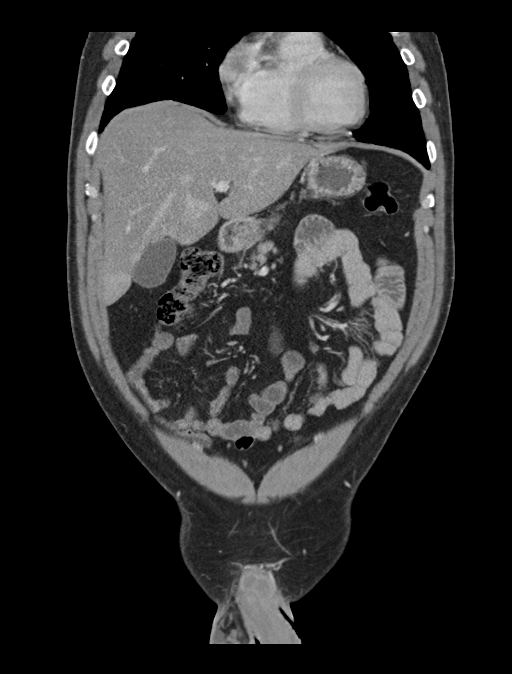
[im 55/123  soft-tissue]
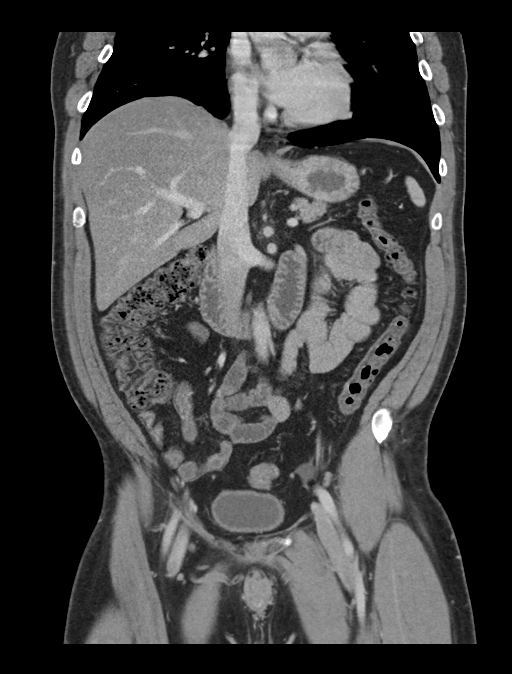
[im 68/123  soft-tissue]
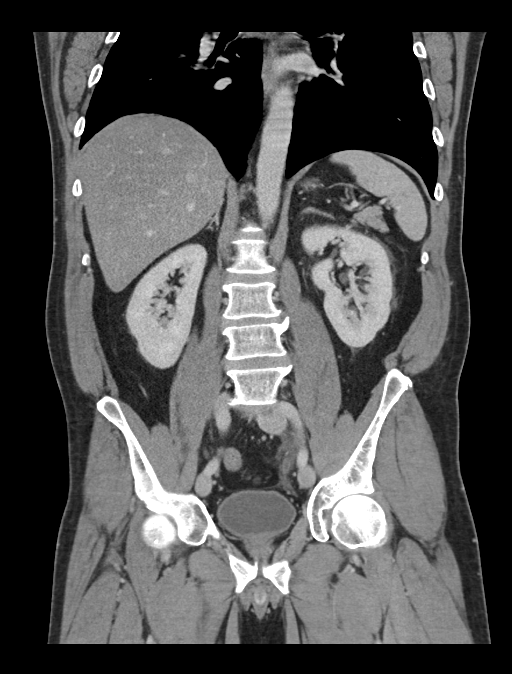

[15 of 46 positions shown; findings below may reference images not displayed]

RADIATION DOSE REDUCTION: This exam was performed according to the
departmental dose-optimization program which includes automated
exposure control, adjustment of the mA and/or kV according to
patient size and/or use of iterative reconstruction technique.

CONTRAST:  100mL OMNIPAQUE IOHEXOL 300 MG/ML  SOLN
FINDINGS: Lower chest: No acute abnormality.

Hepatobiliary: The hepatic parenchyma is diffusely hypodense
compared to the splenic parenchyma consistent with fatty
infiltration. Subcentimeter hypodensity too small to characterize.
No gallstones, gallbladder wall thickening, or pericholecystic
fluid. No biliary dilatation.

Pancreas: No focal lesion. Normal pancreatic contour. No surrounding
inflammatory changes. No main pancreatic ductal dilatation.

Spleen: Normal in size without focal abnormality.

Adrenals/Urinary Tract:

No adrenal nodule bilaterally.

Bilateral kidneys enhance symmetrically.

2 mm calcified stone at the left ureterovesicular junction with
associated mild hydroureteronephrosis. Urothelial thickening is
noted. Nonspecific asymmetric left perinephric stranding compared to
the right.

No left nephrolithiasis. No right nephroureterolithiasis. No right
hydroureteronephrosis.

The urinary bladder is unremarkable.

Stomach/Bowel: Stomach is within normal limits. No evidence of bowel
wall thickening or dilatation. Appendix appears normal.

Vascular/Lymphatic: No abdominal aorta or iliac aneurysm. No
abdominal, pelvic, or inguinal lymphadenopathy.

Reproductive: Prostate is unremarkable.

Other: No intraperitoneal free fluid. No intraperitoneal free gas.
No organized fluid collection.

Musculoskeletal:

No abdominal wall hernia or abnormality.

No suspicious lytic or blastic osseous lesions. No acute displaced
fracture. Multilevel degenerative changes of the spine.
IMPRESSION: 1. Obstructive 2 mm left ureterovesicular junction stone. Associated
urothelial thickening concerning for superimposed infection.
Correlate with urinalysis.
2. Hepatic steatosis.

## 2023-03-28 ENCOUNTER — Other Ambulatory Visit: Payer: Self-pay

## 2023-11-01 ENCOUNTER — Ambulatory Visit: Payer: No Typology Code available for payment source | Admitting: Family Medicine

## 2023-11-01 ENCOUNTER — Encounter: Payer: Self-pay | Admitting: Family Medicine

## 2023-11-01 VITALS — BP 152/92 | HR 93 | Temp 98.7°F | Ht 66.0 in | Wt 196.0 lb

## 2023-11-01 DIAGNOSIS — M795 Residual foreign body in soft tissue: Secondary | ICD-10-CM

## 2023-11-01 MED ORDER — CEPHALEXIN 500 MG PO CAPS: 500.0000 mg | ORAL_CAPSULE | Freq: Three times a day (TID) | ORAL | 0 refills | Status: AC

## 2023-11-01 NOTE — Progress Notes (Signed)
Subjective:    Patient ID: Alex Hurst, male    DOB: 1976/01/27, 48 y.o.   MRN: [id number]  Foot Pain    Patient states that he stepped on a piece of glass over a year ago.  In the patient's right foot, proximal to the fourth MTP joint on the plantar surface there is a firm subcutaneous area roughly 3 mm in diameter.  This is tender to palpation.  No past medical history on file. No past surgical history on file. Current Outpatient Medications on File Prior to Visit  Medication Sig Dispense Refill   ALPRAZolam (XANAX) 0.5 MG tablet Take 1 tablet (0.5 mg total) by mouth 3 (three) times daily as needed. (Patient not taking: Reported on 11/01/2023) 30 tablet 0   diclofenac (VOLTAREN) 75 MG EC tablet Take 1 tablet (75 mg total) by mouth 2 (two) times daily. (Patient not taking: Reported on 11/01/2023) 60 tablet 2   fenofibrate (TRICOR) 145 MG tablet TAKE 1 TABLET BY MOUTH EVERY DAY (Patient not taking: Reported on 11/01/2023) 30 tablet 0   fluticasone (FLONASE) 50 MCG/ACT nasal spray Place 2 sprays into both nostrils daily. (Patient not taking: Reported on 11/01/2023)     meloxicam (MOBIC) 15 MG tablet TAKE 1 TABLET(15 MG) BY MOUTH DAILY (Patient not taking: Reported on 11/01/2023) 30 tablet 3   oxyCODONE-acetaminophen (PERCOCET) 5-325 MG tablet Take 1-2 tablets by mouth every 6 (six) hours as needed. (Patient not taking: Reported on 11/01/2023) 12 tablet 0   No current facility-administered medications on file prior to visit.   No Known Allergies Social History   Socioeconomic History   Marital status: Married    Spouse name: Not on file   Number of children: Not on file   Years of education: Not on file   Highest education level: Not on file  Occupational History   Not on file  Tobacco Use   Smoking status: Every Day   Smokeless tobacco: Never  Vaping Use   Vaping status: Never Used  Substance and Sexual Activity   Alcohol use: Yes   Drug use: No   Sexual activity: Not on file   Other Topics Concern   Not on file  Social History Narrative   Not on file   Social Drivers of Health   Financial Resource Strain: Not on file  Food Insecurity: Not on file  Transportation Needs: Not on file  Physical Activity: Not on file  Stress: Not on file  Social Connections: Not on file  Intimate Partner Violence: Not on file      Review of Systems  All other systems reviewed and are negative.      Objective:   Physical Exam Vitals reviewed.  Constitutional:      General: He is not in acute distress.    Appearance: He is well-developed. He is not diaphoretic.  Cardiovascular:     Rate and Rhythm: Normal rate and regular rhythm.     Heart sounds: Normal heart sounds. No murmur heard.    No friction rub. No gallop.  Pulmonary:     Effort: Pulmonary effort is normal. No respiratory distress.     Breath sounds: Normal breath sounds. No stridor. No wheezing or rales.  Musculoskeletal:     Lumbar back: Spasms and tenderness present. No swelling, edema, deformity or bony tenderness. Decreased range of motion.       Feet:           Assessment & Plan:  Foreign body (FB)  in soft tissue I anesthetized the area with 0.1% lidocaine with epinephrine.  I then prepped the area in sterile fashion with Betadine.  I performed a 4 mm punch biopsy of the entire area down to the subcutaneous tissue.  I removed the entire core with a pair of forceps and then approximated the skin edges with 1 simple interrupted 3-0 Ethilon suture.  I then covered the skin with Neosporin and a dressing and wrapped in Coban.  Wound care was discussed.  Return in 1 week for suture removal.

## 2023-11-02 NOTE — Consult Note (Signed)
No I did not, I performed a punch biopsy technique to remove a foreign body.  No pathology specimen was sent.

## 2023-11-08 ENCOUNTER — Ambulatory Visit (INDEPENDENT_AMBULATORY_CARE_PROVIDER_SITE_OTHER): Payer: No Typology Code available for payment source | Admitting: Family Medicine

## 2023-11-08 ENCOUNTER — Encounter: Payer: Self-pay | Admitting: Family Medicine

## 2023-11-08 VITALS — BP 136/82 | HR 74 | Temp 98.4°F | Ht 66.0 in | Wt 194.0 lb

## 2023-11-08 DIAGNOSIS — Z0001 Encounter for general adult medical examination with abnormal findings: Secondary | ICD-10-CM

## 2023-11-08 DIAGNOSIS — Z23 Encounter for immunization: Secondary | ICD-10-CM | POA: Diagnosis not present

## 2023-11-08 DIAGNOSIS — Z1211 Encounter for screening for malignant neoplasm of colon: Secondary | ICD-10-CM | POA: Diagnosis not present

## 2023-11-08 DIAGNOSIS — E785 Hyperlipidemia, unspecified: Secondary | ICD-10-CM

## 2023-11-08 DIAGNOSIS — Z0000 Encounter for general adult medical examination without abnormal findings: Secondary | ICD-10-CM

## 2023-11-08 NOTE — Progress Notes (Signed)
 Subjective:    Patient ID: Alex Hurst, male    DOB: 01-21-1976, 48 y.o.   MRN: [id number]  Foot Pain    Patient is a very pleasant 48 year old Caucasian gentleman here today for complete physical exam.  He has a history of dyslipidemia.  Several years ago, he had a fasting lipid panel that showed triglycerides greater than 500.  He is not currently on any medication.  He admits that his diet is poor.  He denies any strong family history of coronary artery disease.  He is due for colon cancer screening.  He is also due for a flu shot. No past medical history on file. No past surgical history on file. Current Outpatient Medications on File Prior to Visit  Medication Sig Dispense Refill   cephALEXin  (KEFLEX ) 500 MG capsule Take 1 capsule (500 mg total) by mouth 3 (three) times daily. 21 capsule 0   fluticasone (FLONASE) 50 MCG/ACT nasal spray Place 2 sprays into both nostrils daily.     meloxicam  (MOBIC ) 15 MG tablet TAKE 1 TABLET(15 MG) BY MOUTH DAILY 30 tablet 3   No current facility-administered medications on file prior to visit.   No Known Allergies Social History   Socioeconomic History   Marital status: Married    Spouse name: Not on file   Number of children: Not on file   Years of education: Not on file   Highest education level: Not on file  Occupational History   Not on file  Tobacco Use   Smoking status: Every Day   Smokeless tobacco: Never  Vaping Use   Vaping status: Never Used  Substance and Sexual Activity   Alcohol use: Yes   Drug use: No   Sexual activity: Not on file  Other Topics Concern   Not on file  Social History Narrative   Not on file   Social Drivers of Health   Financial Resource Strain: Not on file  Food Insecurity: Not on file  Transportation Needs: Not on file  Physical Activity: Not on file  Stress: Not on file  Social Connections: Not on file  Intimate Partner Violence: Not on file      Review of Systems  All other systems  reviewed and are negative.      Objective:   Physical Exam Vitals reviewed.  Constitutional:      General: He is not in acute distress.    Appearance: Normal appearance. He is well-developed and normal weight. He is not ill-appearing, toxic-appearing or diaphoretic.  HENT:     Head: Normocephalic and atraumatic.     Right Ear: Tympanic membrane and ear canal normal.     Left Ear: Tympanic membrane and ear canal normal.     Nose: No congestion or rhinorrhea.     Mouth/Throat:     Mouth: Mucous membranes are moist.     Pharynx: Oropharynx is clear. No oropharyngeal exudate or posterior oropharyngeal erythema.  Eyes:     General: No scleral icterus.       Right eye: No discharge.        Left eye: No discharge.     Extraocular Movements: Extraocular movements intact.     Conjunctiva/sclera: Conjunctivae normal.     Pupils: Pupils are equal, round, and reactive to light.  Neck:     Vascular: No carotid bruit.  Cardiovascular:     Rate and Rhythm: Normal rate and regular rhythm.     Heart sounds: Normal heart sounds. No murmur heard.  No friction rub. No gallop.  Pulmonary:     Effort: Pulmonary effort is normal. No respiratory distress.     Breath sounds: Normal breath sounds. No stridor. No wheezing, rhonchi or rales.  Abdominal:     General: There is no distension.     Palpations: There is no mass.     Tenderness: There is no abdominal tenderness. There is no guarding or rebound.     Hernia: No hernia is present.  Musculoskeletal:     Right lower leg: No edema.     Left lower leg: No edema.  Lymphadenopathy:     Cervical: No cervical adenopathy.  Neurological:     General: No focal deficit present.     Mental Status: He is alert and oriented to person, place, and time. Mental status is at baseline.     Cranial Nerves: No cranial nerve deficit.     Motor: No weakness.     Coordination: Coordination normal.     Gait: Gait normal.  Psychiatric:        Mood and Affect:  Mood normal.        Behavior: Behavior normal.        Thought Content: Thought content normal.        Judgment: Judgment normal.           Assessment & Plan:  Colon cancer screening - Plan: Ambulatory referral to Gastroenterology  Dyslipidemia - Plan: CT CARDIAC SCORING (SELF PAY ONLY), CBC with Differential/Platelet, COMPLETE METABOLIC PANEL WITH GFR, Lipid panel  Need for vaccination - Plan: Flu vaccine trivalent PF, 6mos and older(Flulaval,Afluria,Fluarix,Fluzone)  General medical exam Physical exam today is normal.  Patient received his flu shot.  My biggest concern is his cholesterol.  We will check a fasting lipid panel.  Also talked the patient into getting a coronary artery calcium score.  I believe that this may help further risk stratify the patient to determine how aggressively we need to treat his dyslipidemia.  Check CBC CMP and fasting lipid panel.  Schedule patient for colonoscopy.  Regular anticipatory guidance is provided

## 2023-11-09 ENCOUNTER — Other Ambulatory Visit: Payer: No Typology Code available for payment source

## 2023-11-09 DIAGNOSIS — E785 Hyperlipidemia, unspecified: Secondary | ICD-10-CM

## 2023-11-10 LAB — CBC WITH DIFFERENTIAL/PLATELET
Absolute Lymphocytes: 1860 {cells}/uL (ref 850–3900)
Absolute Monocytes: 654 {cells}/uL (ref 200–950)
Basophils Absolute: 18 {cells}/uL (ref 0–200)
Basophils Relative: 0.3 %
Eosinophils Absolute: 90 {cells}/uL (ref 15–500)
Eosinophils Relative: 1.5 %
HCT: 46.6 % (ref 38.5–50.0)
Hemoglobin: 15.7 g/dL (ref 13.2–17.1)
MCH: 31.3 pg (ref 27.0–33.0)
MCHC: 33.7 g/dL (ref 32.0–36.0)
MCV: 93 fL (ref 80.0–100.0)
MPV: 10.1 fL (ref 7.5–12.5)
Monocytes Relative: 10.9 %
Neutro Abs: 3378 {cells}/uL (ref 1500–7800)
Neutrophils Relative %: 56.3 %
Platelets: 342 10*3/uL (ref 140–400)
RBC: 5.01 10*6/uL (ref 4.20–5.80)
RDW: 12 % (ref 11.0–15.0)
Total Lymphocyte: 31 %
WBC: 6 10*3/uL (ref 3.8–10.8)

## 2023-11-10 LAB — COMPLETE METABOLIC PANEL WITH GFR
AG Ratio: 2 (calc) (ref 1.0–2.5)
ALT: 29 U/L (ref 9–46)
AST: 17 U/L (ref 10–40)
Albumin: 4.5 g/dL (ref 3.6–5.1)
Alkaline phosphatase (APISO): 86 U/L (ref 36–130)
BUN: 13 mg/dL (ref 7–25)
CO2: 28 mmol/L (ref 20–32)
Calcium: 9.4 mg/dL (ref 8.6–10.3)
Chloride: 104 mmol/L (ref 98–110)
Creat: 0.94 mg/dL (ref 0.60–1.29)
Globulin: 2.2 g/dL (ref 1.9–3.7)
Glucose, Bld: 92 mg/dL (ref 65–99)
Potassium: 4.5 mmol/L (ref 3.5–5.3)
Sodium: 140 mmol/L (ref 135–146)
Total Bilirubin: 0.6 mg/dL (ref 0.2–1.2)
Total Protein: 6.7 g/dL (ref 6.1–8.1)
eGFR: 101 mL/min/{1.73_m2} (ref >=60)

## 2023-11-10 LAB — LIPID PANEL
Cholesterol: 220 mg/dL — ABNORMAL HIGH (ref <200)
HDL: 41 mg/dL (ref >=40)
Non-HDL Cholesterol (Calc): 179 mg/dL — ABNORMAL HIGH (ref <130)
Total CHOL/HDL Ratio: 5.4 (calc) — ABNORMAL HIGH (ref <5.0)
Triglycerides: 531 mg/dL — ABNORMAL HIGH (ref <150)

## 2023-11-16 ENCOUNTER — Other Ambulatory Visit: Payer: Self-pay

## 2023-11-16 ENCOUNTER — Ambulatory Visit (HOSPITAL_COMMUNITY)
Admission: RE | Admit: 2023-11-16 | Discharge: 2023-11-16 | Disposition: A | Discharge status: Home / Self Care | Payer: Self-pay | Source: Ambulatory Visit | Attending: Family Medicine | Admitting: Family Medicine

## 2023-11-16 DIAGNOSIS — E785 Hyperlipidemia, unspecified: Secondary | ICD-10-CM | POA: Insufficient documentation

## 2023-11-16 MED ORDER — FENOFIBRATE 145 MG PO TABS: 145.0000 mg | ORAL_TABLET | Freq: Every day | ORAL | 2 refills | Status: DC

## 2023-12-21 ENCOUNTER — Encounter: Payer: Self-pay | Admitting: Pediatrics

## 2024-01-25 ENCOUNTER — Encounter

## 2024-02-08 ENCOUNTER — Encounter: Admitting: Pediatrics

## 2024-02-11 ENCOUNTER — Other Ambulatory Visit: Payer: No Typology Code available for payment source

## 2024-02-11 DIAGNOSIS — D72829 Elevated white blood cell count, unspecified: Secondary | ICD-10-CM

## 2024-02-11 DIAGNOSIS — E785 Hyperlipidemia, unspecified: Secondary | ICD-10-CM

## 2024-02-12 ENCOUNTER — Ambulatory Visit: Payer: Self-pay | Admitting: Family Medicine

## 2024-02-12 LAB — COMPLETE METABOLIC PANEL WITHOUT GFR
AG Ratio: 2.4 (calc) (ref 1.0–2.5)
ALT: 27 U/L (ref 9–46)
AST: 22 U/L (ref 10–40)
Albumin: 4.5 g/dL (ref 3.6–5.1)
Alkaline phosphatase (APISO): 67 U/L (ref 36–130)
BUN: 15 mg/dL (ref 7–25)
CO2: 27 mmol/L (ref 20–32)
Calcium: 9.2 mg/dL (ref 8.6–10.3)
Chloride: 105 mmol/L (ref 98–110)
Creat: 0.93 mg/dL (ref 0.60–1.29)
Globulin: 1.9 g/dL (ref 1.9–3.7)
Glucose, Bld: 92 mg/dL (ref 65–99)
Potassium: 4.5 mmol/L (ref 3.5–5.3)
Sodium: 140 mmol/L (ref 135–146)
Total Bilirubin: 0.6 mg/dL (ref 0.2–1.2)
Total Protein: 6.4 g/dL (ref 6.1–8.1)

## 2024-02-12 LAB — CBC WITH DIFFERENTIAL/PLATELET
Absolute Lymphocytes: 2035 {cells}/uL (ref 850–3900)
Absolute Monocytes: 563 {cells}/uL (ref 200–950)
Basophils Absolute: 38 {cells}/uL (ref 0–200)
Basophils Relative: 0.6 %
Eosinophils Absolute: 109 {cells}/uL (ref 15–500)
Eosinophils Relative: 1.7 %
HCT: 47.5 % (ref 38.5–50.0)
Hemoglobin: 15.7 g/dL (ref 13.2–17.1)
MCH: 30.6 pg (ref 27.0–33.0)
MCHC: 33.1 g/dL (ref 32.0–36.0)
MCV: 92.6 fL (ref 80.0–100.0)
MPV: 10.3 fL (ref 7.5–12.5)
Monocytes Relative: 8.8 %
Neutro Abs: 3654 {cells}/uL (ref 1500–7800)
Neutrophils Relative %: 57.1 %
Platelets: 386 10*3/uL (ref 140–400)
RBC: 5.13 10*6/uL (ref 4.20–5.80)
RDW: 12.8 % (ref 11.0–15.0)
Total Lymphocyte: 31.8 %
WBC: 6.4 10*3/uL (ref 3.8–10.8)

## 2024-02-12 LAB — LIPID PANEL
Cholesterol: 217 mg/dL — ABNORMAL HIGH (ref <200)
HDL: 42 mg/dL (ref >=40)
LDL Cholesterol (Calc): 125 mg/dL — ABNORMAL HIGH
Non-HDL Cholesterol (Calc): 175 mg/dL — ABNORMAL HIGH (ref <130)
Total CHOL/HDL Ratio: 5.2 (calc) — ABNORMAL HIGH (ref <5.0)
Triglycerides: 352 mg/dL — ABNORMAL HIGH (ref <150)

## 2024-02-13 ENCOUNTER — Other Ambulatory Visit: Payer: Self-pay

## 2024-02-13 MED ORDER — OMEGA-3-ACID ETHYL ESTERS 1 G PO CAPS: 2.0000 g | ORAL_CAPSULE | Freq: Every day | ORAL | 1 refills | Status: DC

## 2024-02-21 ENCOUNTER — Other Ambulatory Visit: Payer: Self-pay | Admitting: Family Medicine

## 2024-04-18 ENCOUNTER — Encounter: Payer: Self-pay | Admitting: Advanced Practice Midwife

## 2024-08-11 ENCOUNTER — Other Ambulatory Visit: Payer: Self-pay | Admitting: Family Medicine

## 2024-08-12 NOTE — Telephone Encounter (Signed)
 Requested Prescriptions  Pending Prescriptions Disp Refills   omega-3 acid ethyl esters (LOVAZA ) 1 g capsule [Pharmacy Med Name: OMEGA-3 ETHYL ESTERS 1 GM CAP] 180 capsule 0    Sig: TAKE 2 CAPSULES BY MOUTH DAILY.     Endocrinology:  Nutritional Agents - omega-3 acid ethyl esters Failed - 08/12/2024 12:43 PM      Failed - Lipid Panel in normal range within the last 12 months    Cholesterol  Date Value Ref Range Status  02/11/2024 217 (H) <200 mg/dL Final   LDL Cholesterol (Calc)  Date Value Ref Range Status  02/11/2024 125 (H) mg/dL (calc) Final    Comment:    Reference range: <100 . Desirable range <100 mg/dL for primary prevention;   <70 mg/dL for patients with CHD or diabetic patients  with > or = 2 CHD risk factors. [facility] LDL-C is now calculated using the Martin-Hopkins  calculation, which is a validated novel method providing  better accuracy than the Friedewald equation in the  estimation of LDL-C.  Gladis APPLETHWAITE et al. SANDREA. 7986;689(80): 2061-2068  (http://education.[facility].com/faq/FAQ164)    HDL  Date Value Ref Range Status  02/11/2024 42 > OR = 40 mg/dL Final   Triglycerides  Date Value Ref Range Status  02/11/2024 352 (H) <150 mg/dL Final    Comment:    . If a non-fasting specimen was collected, consider repeat triglyceride testing on a fasting specimen if clinically indicated.  Veatrice et al. J. of Clin. Lipidol. 2015;9:129-169. [facility]          Passed - Valid encounter within last 12 months    Recent Outpatient Visits           9 months ago Colon cancer screening   [place] [facility] Pick[place], Butler DASEN, MD   9 months ago Foreign body (FB) in soft tissue   [place] [facility] Pickard, Butler DASEN, MD
